# Patient Record
Sex: Male | Born: 1975
Health system: Southern US, Community
[De-identification: ages and names within clinical notes are randomized; demographics above are authoritative.]

## PROBLEM LIST (undated history)

## (undated) DIAGNOSIS — K5730 Diverticulosis of large intestine without perforation or abscess without bleeding: Secondary | ICD-10-CM

## (undated) DIAGNOSIS — G039 Meningitis, unspecified: Secondary | ICD-10-CM

---

## 2009-12-30 ENCOUNTER — Emergency Department (HOSPITAL_COMMUNITY): Admission: EM | Admit: 2009-12-30 | Discharge: 2009-12-30 | Payer: Self-pay | Admitting: Emergency Medicine

## 2010-12-22 ENCOUNTER — Emergency Department (HOSPITAL_COMMUNITY)
Admission: EM | Admit: 2010-12-22 | Discharge: 2010-12-22 | Disposition: A | Discharge status: Home / Self Care | Payer: Self-pay | Attending: Emergency Medicine | Admitting: Emergency Medicine

## 2010-12-22 ENCOUNTER — Emergency Department (HOSPITAL_COMMUNITY): Payer: Self-pay

## 2010-12-22 DIAGNOSIS — M2560 Stiffness of unspecified joint, not elsewhere classified: Secondary | ICD-10-CM | POA: Insufficient documentation

## 2010-12-22 DIAGNOSIS — M545 Low back pain, unspecified: Secondary | ICD-10-CM | POA: Insufficient documentation

## 2015-08-24 ENCOUNTER — Emergency Department (HOSPITAL_BASED_OUTPATIENT_CLINIC_OR_DEPARTMENT_OTHER)
Admission: EM | Admit: 2015-08-24 | Discharge: 2015-08-24 | Disposition: A | Discharge status: Home / Self Care | Payer: BLUE CROSS/BLUE SHIELD | Attending: Emergency Medicine | Admitting: Emergency Medicine

## 2015-08-24 ENCOUNTER — Encounter (HOSPITAL_BASED_OUTPATIENT_CLINIC_OR_DEPARTMENT_OTHER): Payer: Self-pay | Admitting: *Deleted

## 2015-08-24 DIAGNOSIS — Z8719 Personal history of other diseases of the digestive system: Secondary | ICD-10-CM | POA: Insufficient documentation

## 2015-08-24 DIAGNOSIS — M542 Cervicalgia: Secondary | ICD-10-CM | POA: Diagnosis present

## 2015-08-24 DIAGNOSIS — M436 Torticollis: Secondary | ICD-10-CM | POA: Diagnosis not present

## 2015-08-24 DIAGNOSIS — Z8619 Personal history of other infectious and parasitic diseases: Secondary | ICD-10-CM | POA: Diagnosis not present

## 2015-08-24 HISTORY — DX: Diverticulosis of large intestine without perforation or abscess without bleeding: K57.30

## 2015-08-24 HISTORY — DX: Meningitis, unspecified: G03.9

## 2015-08-24 MED ORDER — CYCLOBENZAPRINE HCL 5 MG PO TABS: 5.0000 mg | ORAL_TABLET | Freq: Three times a day (TID) | ORAL | Status: AC | PRN

## 2015-08-24 MED FILL — CYCLOBENZAPRINE 5 MG TABLET: 5 | 6 days supply | Qty: 20 | Fill #0

## 2015-08-24 NOTE — ED Provider Notes (Signed)
CSN: [id number]     Arrival date & time 08/24/15  [id number] History   First MD Initiated Contact with Patient 08/24/15 0935     Chief Complaint  Patient presents with  . Neck Pain     (Consider location/radiation/quality/duration/timing/severity/associated sxs/prior Treatment) HPI  Pt presenting with c/o left sided neck pain.  Pain began when he awoke from sleep. Had used different pillows last night.  Pain is worse with turning his head side to side.  He did take an ibuprofen prior to arrival and states he is now able to move his neck more freely than before.  No fever,  Denies any preceding illness. No headache, no nausea.  He has a hx of viral meningitis years ago but states this feels very different from that.  Pain is sharp and worse with movement and palpation.  He has no changes in vision or speech, no weakness of arms or legs.  He had no preceding trauma.  There are no other associated systemic symptoms, there are no other alleviating or modifying factors.   Past Medical History  Diagnosis Date  . Diverticula of colon   . Meningitis    History reviewed. No pertinent past surgical history. No family history on file. Social History  Substance Use Topics  . Smoking status: Never Smoker   . Smokeless tobacco: None  . Alcohol Use: Yes    Review of Systems  ROS reviewed and all otherwise negative except for mentioned in HPI    Allergies  Shellfish allergy  Home Medications   Prior to Admission medications   Medication Sig Start Date End Date Taking? Authorizing Provider  cyclobenzaprine (FLEXERIL) 5 MG tablet Take 1 tablet (5 mg total) by mouth 3 (three) times daily as needed for muscle spasms. 08/24/15   Jerelyn ScottMartha Linker, MD   BP 139/89 mmHg  Pulse 71  Temp(Src) 98.4 F (36.9 C) (Oral)  Resp 18  Ht 5\' 7"  (1.702 m)  Wt 251 lb (113.853 kg)  BMI 39.30 kg/m2  SpO2 100%  Vitals reviewed Physical Exam  Physical Examination: General appearance - alert, well appearing, and in no  distress Mental status - alert, oriented to person, place, and time Eyes - pupils equal and reactive, extraocular eye movements intact Mouth - mucous membranes moist, pharynx normal without lesions Neck - supple, no significant adenopathy, ttp over left SCM and medial trapezius distribution, some pain with ROM especially turning to the left Chest - clear to auscultation, no wheezes, rales or rhonchi, symmetric air entry Heart - normal rate, regular rhythm, normal S1, S2, no murmurs, rubs, clicks or gallops Neurological - alert, oriented, normal speech, strength 5/5 in extremities x 4 Extremities - peripheral pulses normal, no pedal edema, no clubbing or cyanosis Skin - normal coloration and turgor, no rashes,  ED Course  Procedures (including critical care time) Labs Review Labs Reviewed - No data to display  Imaging Review No results found. I have personally reviewed and evaluated these images and lab results as part of my medical decision-making.   EKG Interpretation None      MDM   Final diagnoses:  Torticollis    Pt presenting with acute onset of left sided neck pain.  Exam is most c/w torticollis.  No meningismus or fever to suggest meningitis.  Neuro exam is normal.  Advised scheduled nsaids, muscle relaxer, heating pad.  Discharged with strict return precautions.  Pt agreeable with plan.    Jerelyn ScottMartha Linker, MD 08/24/15 1053

## 2015-08-24 NOTE — Discharge Instructions (Signed)
Return to the ED with any concerns including weakness of arms or legs, fever, decreased level of alertness/lethargy, or any other alarming symptoms

## 2015-08-24 NOTE — ED Notes (Signed)
Patient states he woke up this morning around 0500 with stiffness in the left neck.  States he slept with a different pillow last night.  Also, patient and spouse states that five years ago, the patient had same neck pain and was positive for viral meningitis.

## 2017-04-05 DIAGNOSIS — M6281 Muscle weakness (generalized): Secondary | ICD-10-CM | POA: Diagnosis not present

## 2017-04-05 DIAGNOSIS — M25562 Pain in left knee: Secondary | ICD-10-CM | POA: Diagnosis not present

## 2017-04-05 DIAGNOSIS — M25662 Stiffness of left knee, not elsewhere classified: Secondary | ICD-10-CM | POA: Diagnosis not present

## 2017-04-05 DIAGNOSIS — G8929 Other chronic pain: Secondary | ICD-10-CM | POA: Diagnosis not present

## 2017-04-08 DIAGNOSIS — M6281 Muscle weakness (generalized): Secondary | ICD-10-CM | POA: Diagnosis not present

## 2017-04-08 DIAGNOSIS — M25662 Stiffness of left knee, not elsewhere classified: Secondary | ICD-10-CM | POA: Diagnosis not present

## 2017-04-08 DIAGNOSIS — M25562 Pain in left knee: Secondary | ICD-10-CM | POA: Diagnosis not present

## 2017-04-08 DIAGNOSIS — R269 Unspecified abnormalities of gait and mobility: Secondary | ICD-10-CM | POA: Diagnosis not present

## 2017-04-09 DIAGNOSIS — G4733 Obstructive sleep apnea (adult) (pediatric): Secondary | ICD-10-CM | POA: Diagnosis not present

## 2017-04-09 DIAGNOSIS — E6609 Other obesity due to excess calories: Secondary | ICD-10-CM | POA: Diagnosis not present

## 2017-04-09 DIAGNOSIS — G8921 Chronic pain due to trauma: Secondary | ICD-10-CM | POA: Diagnosis not present

## 2017-04-10 DIAGNOSIS — M6281 Muscle weakness (generalized): Secondary | ICD-10-CM | POA: Diagnosis not present

## 2017-04-10 DIAGNOSIS — R269 Unspecified abnormalities of gait and mobility: Secondary | ICD-10-CM | POA: Diagnosis not present

## 2017-04-10 DIAGNOSIS — M25562 Pain in left knee: Secondary | ICD-10-CM | POA: Diagnosis not present

## 2017-04-10 DIAGNOSIS — M25662 Stiffness of left knee, not elsewhere classified: Secondary | ICD-10-CM | POA: Diagnosis not present

## 2017-04-12 DIAGNOSIS — M25562 Pain in left knee: Secondary | ICD-10-CM | POA: Diagnosis not present

## 2017-04-12 DIAGNOSIS — M6281 Muscle weakness (generalized): Secondary | ICD-10-CM | POA: Diagnosis not present

## 2017-04-12 DIAGNOSIS — M25662 Stiffness of left knee, not elsewhere classified: Secondary | ICD-10-CM | POA: Diagnosis not present

## 2017-04-12 DIAGNOSIS — R269 Unspecified abnormalities of gait and mobility: Secondary | ICD-10-CM | POA: Diagnosis not present

## 2017-04-22 DIAGNOSIS — M6281 Muscle weakness (generalized): Secondary | ICD-10-CM | POA: Diagnosis not present

## 2017-04-22 DIAGNOSIS — M25562 Pain in left knee: Secondary | ICD-10-CM | POA: Diagnosis not present

## 2017-04-22 DIAGNOSIS — G8929 Other chronic pain: Secondary | ICD-10-CM | POA: Diagnosis not present

## 2017-04-22 DIAGNOSIS — M25662 Stiffness of left knee, not elsewhere classified: Secondary | ICD-10-CM | POA: Diagnosis not present

## 2017-04-24 DIAGNOSIS — G8929 Other chronic pain: Secondary | ICD-10-CM | POA: Diagnosis not present

## 2017-04-24 DIAGNOSIS — M6281 Muscle weakness (generalized): Secondary | ICD-10-CM | POA: Diagnosis not present

## 2017-04-24 DIAGNOSIS — M25562 Pain in left knee: Secondary | ICD-10-CM | POA: Diagnosis not present

## 2017-04-24 DIAGNOSIS — M25662 Stiffness of left knee, not elsewhere classified: Secondary | ICD-10-CM | POA: Diagnosis not present

## 2017-04-26 DIAGNOSIS — M6281 Muscle weakness (generalized): Secondary | ICD-10-CM | POA: Diagnosis not present

## 2017-04-26 DIAGNOSIS — G8929 Other chronic pain: Secondary | ICD-10-CM | POA: Diagnosis not present

## 2017-04-26 DIAGNOSIS — M25662 Stiffness of left knee, not elsewhere classified: Secondary | ICD-10-CM | POA: Diagnosis not present

## 2017-04-26 DIAGNOSIS — M25562 Pain in left knee: Secondary | ICD-10-CM | POA: Diagnosis not present

## 2017-04-29 DIAGNOSIS — M6281 Muscle weakness (generalized): Secondary | ICD-10-CM | POA: Diagnosis not present

## 2017-04-29 DIAGNOSIS — R269 Unspecified abnormalities of gait and mobility: Secondary | ICD-10-CM | POA: Diagnosis not present

## 2017-04-29 DIAGNOSIS — M25562 Pain in left knee: Secondary | ICD-10-CM | POA: Diagnosis not present

## 2017-04-29 DIAGNOSIS — M25662 Stiffness of left knee, not elsewhere classified: Secondary | ICD-10-CM | POA: Diagnosis not present

## 2017-05-01 DIAGNOSIS — R269 Unspecified abnormalities of gait and mobility: Secondary | ICD-10-CM | POA: Diagnosis not present

## 2017-05-01 DIAGNOSIS — M25662 Stiffness of left knee, not elsewhere classified: Secondary | ICD-10-CM | POA: Diagnosis not present

## 2017-05-01 DIAGNOSIS — M25562 Pain in left knee: Secondary | ICD-10-CM | POA: Diagnosis not present

## 2017-05-01 DIAGNOSIS — M6281 Muscle weakness (generalized): Secondary | ICD-10-CM | POA: Diagnosis not present

## 2017-05-02 DIAGNOSIS — E6609 Other obesity due to excess calories: Secondary | ICD-10-CM | POA: Diagnosis not present

## 2017-05-02 DIAGNOSIS — G4733 Obstructive sleep apnea (adult) (pediatric): Secondary | ICD-10-CM | POA: Diagnosis not present

## 2017-05-02 DIAGNOSIS — G8921 Chronic pain due to trauma: Secondary | ICD-10-CM | POA: Diagnosis not present

## 2017-05-03 DIAGNOSIS — M25662 Stiffness of left knee, not elsewhere classified: Secondary | ICD-10-CM | POA: Diagnosis not present

## 2017-05-03 DIAGNOSIS — M6281 Muscle weakness (generalized): Secondary | ICD-10-CM | POA: Diagnosis not present

## 2017-05-03 DIAGNOSIS — M25562 Pain in left knee: Secondary | ICD-10-CM | POA: Diagnosis not present

## 2017-05-03 DIAGNOSIS — R269 Unspecified abnormalities of gait and mobility: Secondary | ICD-10-CM | POA: Diagnosis not present

## 2017-05-06 DIAGNOSIS — M6281 Muscle weakness (generalized): Secondary | ICD-10-CM | POA: Diagnosis not present

## 2017-05-06 DIAGNOSIS — M25662 Stiffness of left knee, not elsewhere classified: Secondary | ICD-10-CM | POA: Diagnosis not present

## 2017-05-06 DIAGNOSIS — M25562 Pain in left knee: Secondary | ICD-10-CM | POA: Diagnosis not present

## 2017-05-06 DIAGNOSIS — R269 Unspecified abnormalities of gait and mobility: Secondary | ICD-10-CM | POA: Diagnosis not present

## 2017-05-08 DIAGNOSIS — M6281 Muscle weakness (generalized): Secondary | ICD-10-CM | POA: Diagnosis not present

## 2017-05-08 DIAGNOSIS — M25662 Stiffness of left knee, not elsewhere classified: Secondary | ICD-10-CM | POA: Diagnosis not present

## 2017-05-08 DIAGNOSIS — R269 Unspecified abnormalities of gait and mobility: Secondary | ICD-10-CM | POA: Diagnosis not present

## 2017-05-08 DIAGNOSIS — M25562 Pain in left knee: Secondary | ICD-10-CM | POA: Diagnosis not present

## 2017-05-22 DIAGNOSIS — R269 Unspecified abnormalities of gait and mobility: Secondary | ICD-10-CM | POA: Diagnosis not present

## 2017-05-22 DIAGNOSIS — M25562 Pain in left knee: Secondary | ICD-10-CM | POA: Diagnosis not present

## 2017-05-22 DIAGNOSIS — G8929 Other chronic pain: Secondary | ICD-10-CM | POA: Diagnosis not present

## 2017-05-22 DIAGNOSIS — M6281 Muscle weakness (generalized): Secondary | ICD-10-CM | POA: Diagnosis not present

## 2017-05-24 DIAGNOSIS — M25662 Stiffness of left knee, not elsewhere classified: Secondary | ICD-10-CM | POA: Diagnosis not present

## 2017-05-24 DIAGNOSIS — M6281 Muscle weakness (generalized): Secondary | ICD-10-CM | POA: Diagnosis not present

## 2017-05-24 DIAGNOSIS — M25562 Pain in left knee: Secondary | ICD-10-CM | POA: Diagnosis not present

## 2017-05-24 DIAGNOSIS — G8929 Other chronic pain: Secondary | ICD-10-CM | POA: Diagnosis not present

## 2017-05-31 DIAGNOSIS — M25662 Stiffness of left knee, not elsewhere classified: Secondary | ICD-10-CM | POA: Diagnosis not present

## 2017-05-31 DIAGNOSIS — M25562 Pain in left knee: Secondary | ICD-10-CM | POA: Diagnosis not present

## 2017-05-31 DIAGNOSIS — M6281 Muscle weakness (generalized): Secondary | ICD-10-CM | POA: Diagnosis not present

## 2017-05-31 DIAGNOSIS — G8929 Other chronic pain: Secondary | ICD-10-CM | POA: Diagnosis not present

## 2017-06-05 DIAGNOSIS — E6609 Other obesity due to excess calories: Secondary | ICD-10-CM | POA: Diagnosis not present

## 2017-06-05 DIAGNOSIS — G8921 Chronic pain due to trauma: Secondary | ICD-10-CM | POA: Diagnosis not present

## 2017-06-10 DIAGNOSIS — M25662 Stiffness of left knee, not elsewhere classified: Secondary | ICD-10-CM | POA: Diagnosis not present

## 2017-06-10 DIAGNOSIS — M6281 Muscle weakness (generalized): Secondary | ICD-10-CM | POA: Diagnosis not present

## 2017-06-10 DIAGNOSIS — G8929 Other chronic pain: Secondary | ICD-10-CM | POA: Diagnosis not present

## 2017-06-10 DIAGNOSIS — M25562 Pain in left knee: Secondary | ICD-10-CM | POA: Diagnosis not present

## 2017-06-12 DIAGNOSIS — M6281 Muscle weakness (generalized): Secondary | ICD-10-CM | POA: Diagnosis not present

## 2017-06-12 DIAGNOSIS — G8929 Other chronic pain: Secondary | ICD-10-CM | POA: Diagnosis not present

## 2017-06-12 DIAGNOSIS — M25562 Pain in left knee: Secondary | ICD-10-CM | POA: Diagnosis not present

## 2017-06-12 DIAGNOSIS — M25662 Stiffness of left knee, not elsewhere classified: Secondary | ICD-10-CM | POA: Diagnosis not present

## 2017-06-14 DIAGNOSIS — M25562 Pain in left knee: Secondary | ICD-10-CM | POA: Diagnosis not present

## 2017-06-14 DIAGNOSIS — G8929 Other chronic pain: Secondary | ICD-10-CM | POA: Diagnosis not present

## 2017-06-14 DIAGNOSIS — M6281 Muscle weakness (generalized): Secondary | ICD-10-CM | POA: Diagnosis not present

## 2017-06-14 DIAGNOSIS — M25662 Stiffness of left knee, not elsewhere classified: Secondary | ICD-10-CM | POA: Diagnosis not present

## 2017-06-17 DIAGNOSIS — M25562 Pain in left knee: Secondary | ICD-10-CM | POA: Diagnosis not present

## 2017-06-17 DIAGNOSIS — M6281 Muscle weakness (generalized): Secondary | ICD-10-CM | POA: Diagnosis not present

## 2017-06-17 DIAGNOSIS — G8929 Other chronic pain: Secondary | ICD-10-CM | POA: Diagnosis not present

## 2017-06-17 DIAGNOSIS — R269 Unspecified abnormalities of gait and mobility: Secondary | ICD-10-CM | POA: Diagnosis not present

## 2017-06-19 DIAGNOSIS — M6281 Muscle weakness (generalized): Secondary | ICD-10-CM | POA: Diagnosis not present

## 2017-06-19 DIAGNOSIS — M25562 Pain in left knee: Secondary | ICD-10-CM | POA: Diagnosis not present

## 2017-06-19 DIAGNOSIS — G8929 Other chronic pain: Secondary | ICD-10-CM | POA: Diagnosis not present

## 2017-06-19 DIAGNOSIS — M25662 Stiffness of left knee, not elsewhere classified: Secondary | ICD-10-CM | POA: Diagnosis not present

## 2017-06-21 DIAGNOSIS — M6281 Muscle weakness (generalized): Secondary | ICD-10-CM | POA: Diagnosis not present

## 2017-06-21 DIAGNOSIS — M25662 Stiffness of left knee, not elsewhere classified: Secondary | ICD-10-CM | POA: Diagnosis not present

## 2017-06-21 DIAGNOSIS — G8929 Other chronic pain: Secondary | ICD-10-CM | POA: Diagnosis not present

## 2017-06-21 DIAGNOSIS — M25562 Pain in left knee: Secondary | ICD-10-CM | POA: Diagnosis not present

## 2017-07-09 DIAGNOSIS — Z86718 Personal history of other venous thrombosis and embolism: Secondary | ICD-10-CM | POA: Diagnosis not present

## 2017-07-09 DIAGNOSIS — G8921 Chronic pain due to trauma: Secondary | ICD-10-CM | POA: Diagnosis not present

## 2017-07-10 ENCOUNTER — Other Ambulatory Visit: Payer: Self-pay | Admitting: Internal Medicine

## 2017-07-15 ENCOUNTER — Other Ambulatory Visit: Payer: Self-pay | Admitting: Internal Medicine

## 2017-07-15 DIAGNOSIS — R609 Edema, unspecified: Secondary | ICD-10-CM

## 2017-07-17 ENCOUNTER — Other Ambulatory Visit: Payer: BLUE CROSS/BLUE SHIELD

## 2017-07-23 DIAGNOSIS — Z0000 Encounter for general adult medical examination without abnormal findings: Secondary | ICD-10-CM | POA: Diagnosis not present

## 2017-08-09 DIAGNOSIS — R7303 Prediabetes: Secondary | ICD-10-CM | POA: Diagnosis not present

## 2017-08-09 DIAGNOSIS — Z0001 Encounter for general adult medical examination with abnormal findings: Secondary | ICD-10-CM | POA: Diagnosis not present

## 2017-08-09 DIAGNOSIS — Z23 Encounter for immunization: Secondary | ICD-10-CM | POA: Diagnosis not present

## 2017-08-09 DIAGNOSIS — G8921 Chronic pain due to trauma: Secondary | ICD-10-CM | POA: Diagnosis not present

## 2017-09-11 DIAGNOSIS — G8921 Chronic pain due to trauma: Secondary | ICD-10-CM | POA: Diagnosis not present

## 2017-10-10 DIAGNOSIS — G8921 Chronic pain due to trauma: Secondary | ICD-10-CM | POA: Diagnosis not present

## 2017-11-07 DIAGNOSIS — G8921 Chronic pain due to trauma: Secondary | ICD-10-CM | POA: Diagnosis not present

## 2017-12-10 DIAGNOSIS — G8921 Chronic pain due to trauma: Secondary | ICD-10-CM | POA: Diagnosis not present

## 2018-01-07 DIAGNOSIS — Z760 Encounter for issue of repeat prescription: Secondary | ICD-10-CM | POA: Diagnosis not present

## 2018-01-07 DIAGNOSIS — G8921 Chronic pain due to trauma: Secondary | ICD-10-CM | POA: Diagnosis not present

## 2018-02-06 DIAGNOSIS — L259 Unspecified contact dermatitis, unspecified cause: Secondary | ICD-10-CM | POA: Diagnosis not present

## 2018-02-06 DIAGNOSIS — G8921 Chronic pain due to trauma: Secondary | ICD-10-CM | POA: Diagnosis not present

## 2018-03-06 DIAGNOSIS — G8921 Chronic pain due to trauma: Secondary | ICD-10-CM | POA: Diagnosis not present

## 2018-03-19 ENCOUNTER — Emergency Department
Admission: EM | Admit: 2018-03-19 | Discharge: 2018-03-19 | Disposition: A | Discharge status: Home / Self Care | Payer: BLUE CROSS/BLUE SHIELD | Attending: Emergency Medicine | Admitting: Emergency Medicine

## 2018-03-19 ENCOUNTER — Emergency Department: Payer: BLUE CROSS/BLUE SHIELD

## 2018-03-19 ENCOUNTER — Other Ambulatory Visit: Payer: Self-pay

## 2018-03-19 ENCOUNTER — Encounter: Payer: Self-pay | Admitting: Emergency Medicine

## 2018-03-19 DIAGNOSIS — Y990 Civilian activity done for income or pay: Secondary | ICD-10-CM | POA: Diagnosis not present

## 2018-03-19 DIAGNOSIS — Y9259 Other trade areas as the place of occurrence of the external cause: Secondary | ICD-10-CM | POA: Insufficient documentation

## 2018-03-19 DIAGNOSIS — M238X2 Other internal derangements of left knee: Secondary | ICD-10-CM | POA: Diagnosis not present

## 2018-03-19 DIAGNOSIS — Y9389 Activity, other specified: Secondary | ICD-10-CM | POA: Diagnosis not present

## 2018-03-19 DIAGNOSIS — S8992XA Unspecified injury of left lower leg, initial encounter: Secondary | ICD-10-CM | POA: Diagnosis not present

## 2018-03-19 DIAGNOSIS — M2392 Unspecified internal derangement of left knee: Secondary | ICD-10-CM | POA: Diagnosis not present

## 2018-03-19 DIAGNOSIS — W19XXXA Unspecified fall, initial encounter: Secondary | ICD-10-CM | POA: Diagnosis not present

## 2018-03-19 DIAGNOSIS — W010XXA Fall on same level from slipping, tripping and stumbling without subsequent striking against object, initial encounter: Secondary | ICD-10-CM | POA: Insufficient documentation

## 2018-03-19 DIAGNOSIS — M25562 Pain in left knee: Secondary | ICD-10-CM | POA: Diagnosis not present

## 2018-03-19 MED ORDER — OXYCODONE-ACETAMINOPHEN 5-325 MG PO TABS
2.0000 | ORAL_TABLET | Freq: Once | ORAL | Status: AC
  Administered 2018-03-19: 2 via ORAL
  Filled 2018-03-19: qty 2

## 2018-03-19 NOTE — ED Notes (Signed)
Attempting to discharge pt , pt not clear on discharge /return to work note.  Spoke with provider Bjorn Loser PA to come speak with pt to help field concerns.  Pt prefers wife to be at bedside to explain return to work note. Waiting on wife

## 2018-03-19 NOTE — ED Provider Notes (Signed)
[facility] Emergency Department Provider Note  ____________________________________________   First MD Initiated Contact with Patient 03/19/18 1113     (approximate)  I have reviewed the triage vital signs and the nursing notes.   HISTORY  Chief Complaint Fall and Knee Pain   HPI Alexander Hill is a 42 y.o. male presents to the ED after he fell at work today due to a wet floor.  Patient states that he did not hit his head and there was no loss of consciousness.  He states that his left knee went behind him.  He presents to the ED via EMS.  He has a history of injury to his left knee and had surgery in [place] for this.  He currently has issues chronically with his knee and is on fentanyl patches, Percocet 10 mg, and Lidoderm patches for chronic pain.  Patient states that he had not taken the Percocet this morning prior to his injury.  He rates his pain as a 10/10.  Patient is finding this is Workmen's Comp.    Past Medical History:  Diagnosis Date  . Diverticula of colon   . Meningitis     There are no active problems to display for this patient.   History reviewed. No pertinent surgical history.  Prior to Admission medications   Medication Sig Start Date End Date Taking? Authorizing Provider  cyclobenzaprine (FLEXERIL) 5 MG tablet Take 1 tablet (5 mg total) by mouth 3 (three) times daily as needed for muscle spasms. 08/24/15   Mabe, Latanya Maudlin, MD    Allergies Shellfish allergy  No family history on file.  Social History Social History   Tobacco Use  . Smoking status: Never Smoker  . Smokeless tobacco: Never Used  Substance Use Topics  . Alcohol use: Yes    Comment: occas  . Drug use: No    Comment: pt takes prescribed pain medication for left knee pain    Review of Systems Constitutional: No fever/chills Eyes: No visual changes. ENT: No trauma. Cardiovascular: Denies chest pain. Respiratory: Denies shortness of  breath. Gastrointestinal:  No nausea, no vomiting.  Musculoskeletal: Positive for left knee pain.  Positive for chronic knee pain. Skin: Negative for rash. Neurological: Negative for headaches, focal weakness or numbness. ____________________________________________   PHYSICAL EXAM:  VITAL SIGNS: ED Triage Vitals  Enc Vitals Group     BP 03/19/18 1109 112/76     Pulse Rate 03/19/18 1109 72     Resp 03/19/18 1109 16     Temp 03/19/18 1109 97.7 F (36.5 C)     Temp Source 03/19/18 1109 Oral     SpO2 03/19/18 1109 97 %     Weight 03/19/18 1110 230 lb (104.3 kg)     Height 03/19/18 1110 5\' 7"  (1.702 m)     Head Circumference --      Peak Flow --      Pain Score 03/19/18 1110 10     Pain Loc --      Pain Edu? --      Excl. in GC? --    Constitutional: Alert and oriented. Well appearing and in no acute distress. Eyes: Conjunctivae are normal. PERRL. EOMI. Head: Atraumatic. Nose: No trauma. Neck: No stridor.   Cardiovascular: Normal rate, regular rhythm. Grossly normal heart sounds.  Good peripheral circulation. Respiratory: Normal respiratory effort.  No retractions. Lungs CTAB. Gastrointestinal: Soft and nontender. No distention.  Musculoskeletal: Examination of left knee there is soft tissue swelling present.  There is no effusion appreciated.  Range of motion is difficult to assess as patient appears to be in a great deal of pain.  He is guarding against any movement at this time.  Skin is intact.  No ecchymosis or abrasions are seen.  Motor or sensory function intact distally.  Pulse present.  Capillary refill is less than 3 seconds.  Patient has a well-healed vertical surgical scar from previous surgery anteriorly. Neurologic:  Normal speech and language. No gross focal neurologic deficits are appreciated.  Skin:  Skin is warm, dry and intact. No rash noted. Psychiatric: Mood and affect are normal. Speech and behavior are  normal.  ____________________________________________   LABS (all labs ordered are listed, but only abnormal results are displayed)  Labs Reviewed - No data to display RADIOLOGY  ED MD interpretation:  Left knee x-ray is negative for acute bony injury.  Total knee replacement hardware is in place.  Official radiology report(s): Dg Knee Complete 4 Views Left  Result Date: 03/19/2018 CLINICAL DATA:  Fall EXAM: LEFT KNEE - COMPLETE 4+ VIEW COMPARISON:  None. FINDINGS: No acute fracture. No dislocation. Postoperative and degenerative changes are noted. A screw is present in the lateral femoral condyle. It is slightly retracted out of the bone. Calcification in the medial collateral ligament. IMPRESSION: No acute bony pathology. Electronically Signed   By: Jolaine Click M.D.   On: 03/19/2018 14:19    ____________________________________________   PROCEDURES  Procedure(s) performed:   .Splint Application Date/Time: 03/19/2018 3:44 PM Performed by: Fran Lowes, NT Authorized by: Tommi Rumps, PA-C   Consent:    Consent obtained:  Verbal   Consent given by:  Patient   Risks discussed:  Pain   Alternatives discussed:  Referral Procedure details:    Laterality:  Left   Location:  Knee   Knee:  L knee   Strapping: yes     Splint type:  Knee immobilizer   Supplies:  Prefabricated splint Post-procedure details:    Pain:  Improved   Sensation:  Normal   Patient tolerance of procedure:  Tolerated well, no immediate complications    Critical Care performed: No  ____________________________________________   INITIAL IMPRESSION / ASSESSMENT AND PLAN / ED COURSE  As part of my medical decision making, I reviewed the following data within the electronic MEDICAL RECORD NUMBER Notes from prior ED visits and [place] Controlled Substance Database  Patient presents to the ED after a fall while at work injuring his left knee.  Patient has chronic pain issues with his left knee as a  result of his total knee replacement.  Patient denies any head injury or loss of consciousness.  He states that he fell with his leg bent backwards.  Patient chronically uses a fentanyl patch and Percocet 10 mg for his chronic knee pain.  X-rays were obtained and no acute bony changes were noted.  Total knee hardware was in place.  This was discussed with the patient.  Knee immobilizer and crutches were given to the patient.  He is instructed to ice and elevate as needed for pain and swelling.  He will continue with his regular pain medication as prescribed by his orthopedist.  Patient was given a letter to take to his supervisor with a list of limitations.  Patient became extremely disgruntled stating that he could not do anything and is agreeing with the date on his letter stating that he can return to work tomorrow with limitations..  He was also not understanding  that he needed to take the letter to his supervisor since this was a Workmen's Comp. injury.  Wife was present and this information was relayed to her with his permission to do so.  Both she states that they will go to his orthopedist that he already has established.   ____________________________________________   FINAL CLINICAL IMPRESSION(S) / ED DIAGNOSES  Final diagnoses:  Acute internal derangement of left knee     ED Discharge Orders    None       Note:  This document was prepared using Dragon voice recognition software and may include unintentional dictation errors.    Tommi Rumps, PA-C 03/19/18 1555    Jeanmarie Plant, MD 03/20/18 [phone]

## 2018-03-19 NOTE — ED Triage Notes (Signed)
Pt here via EMS from work after slipping and falling, landing with left knee bent behind him, had left knee surgery last year and still dealing with pain from surgery. Grabbing sides of bed and appears in pain, filing WC.

## 2018-03-19 NOTE — ED Notes (Signed)
Regarding workman's comp.  Spoke with pt and pt gave number to [employer] [phone]) and supervisors name Derek Mound). This tech called [employer] and was transferred to [place]. Derek Mound then transferred this tech to Electronics engineer. Social research officer, government did not pick up and this tech left a voicemail with a call back number for the manager to call.

## 2018-03-19 NOTE — ED Notes (Signed)
This tech collected the urine drug screen at 1215 and took it to the lab by 1218 to await pickup.

## 2018-03-19 NOTE — ED Triage Notes (Signed)
Denies LOC

## 2018-03-19 NOTE — Discharge Instructions (Signed)
Follow-up with the orthopedist on your discharge papers.  Take work restriction papers to Proofreader.  Ice and elevate your knee as needed for swelling and pain.  Use knee immobilizer for support and protection.  Up walking with crutches only until seen by the orthopedist.  If your company has a different orthopedist for you to follow-up with they will let you know.  Continue taking your regular medication as prescribed by your doctor.

## 2018-03-19 NOTE — ED Notes (Signed)
This tech received a call back from the office manager of [employer] [phone]). She stated that both the urine drug screen and the breath analysis are required.

## 2018-03-20 DIAGNOSIS — G8921 Chronic pain due to trauma: Secondary | ICD-10-CM | POA: Diagnosis not present

## 2018-03-20 DIAGNOSIS — W19XXXA Unspecified fall, initial encounter: Secondary | ICD-10-CM | POA: Diagnosis not present

## 2018-03-20 DIAGNOSIS — M25562 Pain in left knee: Secondary | ICD-10-CM | POA: Diagnosis not present

## 2018-03-20 DIAGNOSIS — M542 Cervicalgia: Secondary | ICD-10-CM | POA: Diagnosis not present

## 2018-03-21 ENCOUNTER — Other Ambulatory Visit: Payer: Self-pay | Admitting: Internal Medicine

## 2018-03-21 ENCOUNTER — Ambulatory Visit
Admission: RE | Admit: 2018-03-21 | Discharge: 2018-03-21 | Disposition: A | Discharge status: Home / Self Care | Payer: BLUE CROSS/BLUE SHIELD | Source: Ambulatory Visit | Attending: Internal Medicine | Admitting: Internal Medicine

## 2018-03-21 DIAGNOSIS — M542 Cervicalgia: Secondary | ICD-10-CM

## 2018-03-21 DIAGNOSIS — S199XXA Unspecified injury of neck, initial encounter: Secondary | ICD-10-CM | POA: Diagnosis not present

## 2018-03-24 DIAGNOSIS — M25562 Pain in left knee: Secondary | ICD-10-CM | POA: Diagnosis not present

## 2018-03-24 DIAGNOSIS — G8921 Chronic pain due to trauma: Secondary | ICD-10-CM | POA: Diagnosis not present

## 2018-03-24 DIAGNOSIS — T50905A Adverse effect of unspecified drugs, medicaments and biological substances, initial encounter: Secondary | ICD-10-CM | POA: Diagnosis not present

## 2018-04-01 DIAGNOSIS — M25521 Pain in right elbow: Secondary | ICD-10-CM | POA: Diagnosis not present

## 2018-04-01 DIAGNOSIS — M25562 Pain in left knee: Secondary | ICD-10-CM | POA: Diagnosis not present

## 2018-04-08 DIAGNOSIS — G8921 Chronic pain due to trauma: Secondary | ICD-10-CM | POA: Diagnosis not present

## 2018-04-14 DIAGNOSIS — M25521 Pain in right elbow: Secondary | ICD-10-CM | POA: Diagnosis not present

## 2018-04-18 DIAGNOSIS — M25562 Pain in left knee: Secondary | ICD-10-CM | POA: Diagnosis not present

## 2018-04-18 DIAGNOSIS — H1013 Acute atopic conjunctivitis, bilateral: Secondary | ICD-10-CM | POA: Diagnosis not present

## 2018-05-07 DIAGNOSIS — G8921 Chronic pain due to trauma: Secondary | ICD-10-CM | POA: Diagnosis not present

## 2018-06-13 DIAGNOSIS — R7303 Prediabetes: Secondary | ICD-10-CM | POA: Diagnosis not present

## 2018-06-13 DIAGNOSIS — G8921 Chronic pain due to trauma: Secondary | ICD-10-CM | POA: Diagnosis not present

## 2018-07-11 DIAGNOSIS — R7303 Prediabetes: Secondary | ICD-10-CM | POA: Diagnosis not present

## 2018-07-11 DIAGNOSIS — G8921 Chronic pain due to trauma: Secondary | ICD-10-CM | POA: Diagnosis not present

## 2018-07-24 DIAGNOSIS — R7303 Prediabetes: Secondary | ICD-10-CM | POA: Diagnosis not present

## 2018-07-24 DIAGNOSIS — Z0001 Encounter for general adult medical examination with abnormal findings: Secondary | ICD-10-CM | POA: Diagnosis not present

## 2018-07-24 DIAGNOSIS — Z136 Encounter for screening for cardiovascular disorders: Secondary | ICD-10-CM | POA: Diagnosis not present

## 2018-08-04 DIAGNOSIS — G8921 Chronic pain due to trauma: Secondary | ICD-10-CM | POA: Diagnosis not present

## 2018-09-09 ENCOUNTER — Other Ambulatory Visit: Payer: Self-pay | Admitting: Internal Medicine

## 2018-09-09 ENCOUNTER — Other Ambulatory Visit: Payer: Self-pay

## 2018-09-09 ENCOUNTER — Ambulatory Visit
Admission: RE | Admit: 2018-09-09 | Discharge: 2018-09-09 | Disposition: A | Discharge status: Home / Self Care | Payer: BLUE CROSS/BLUE SHIELD | Source: Ambulatory Visit | Attending: Internal Medicine | Admitting: Internal Medicine

## 2018-09-09 DIAGNOSIS — M79662 Pain in left lower leg: Secondary | ICD-10-CM

## 2018-09-09 DIAGNOSIS — G8921 Chronic pain due to trauma: Secondary | ICD-10-CM | POA: Diagnosis not present

## 2018-09-09 DIAGNOSIS — M79605 Pain in left leg: Secondary | ICD-10-CM | POA: Diagnosis not present

## 2018-09-30 DIAGNOSIS — M79662 Pain in left lower leg: Secondary | ICD-10-CM | POA: Diagnosis not present

## 2018-09-30 DIAGNOSIS — G8921 Chronic pain due to trauma: Secondary | ICD-10-CM | POA: Diagnosis not present

## 2018-11-05 DIAGNOSIS — G8921 Chronic pain due to trauma: Secondary | ICD-10-CM | POA: Diagnosis not present

## 2018-12-02 DIAGNOSIS — G8921 Chronic pain due to trauma: Secondary | ICD-10-CM | POA: Diagnosis not present

## 2018-12-02 DIAGNOSIS — Z0001 Encounter for general adult medical examination with abnormal findings: Secondary | ICD-10-CM | POA: Diagnosis not present

## 2018-12-02 DIAGNOSIS — Z23 Encounter for immunization: Secondary | ICD-10-CM | POA: Diagnosis not present

## 2018-12-02 DIAGNOSIS — F329 Major depressive disorder, single episode, unspecified: Secondary | ICD-10-CM | POA: Diagnosis not present

## 2019-01-05 DIAGNOSIS — G8921 Chronic pain due to trauma: Secondary | ICD-10-CM | POA: Diagnosis not present

## 2019-01-05 DIAGNOSIS — F39 Unspecified mood [affective] disorder: Secondary | ICD-10-CM | POA: Diagnosis not present

## 2019-01-12 IMAGING — CR DG CERVICAL SPINE 2 OR 3 VIEWS
4 series · 4 of 4 positions shown · non-contrast
Comparison: None.

CLINICAL DATA: Fell at work pain at the base of the neck

EXAM:
CERVICAL SPINE - 2-3 VIEW

[w cervical spine lat]
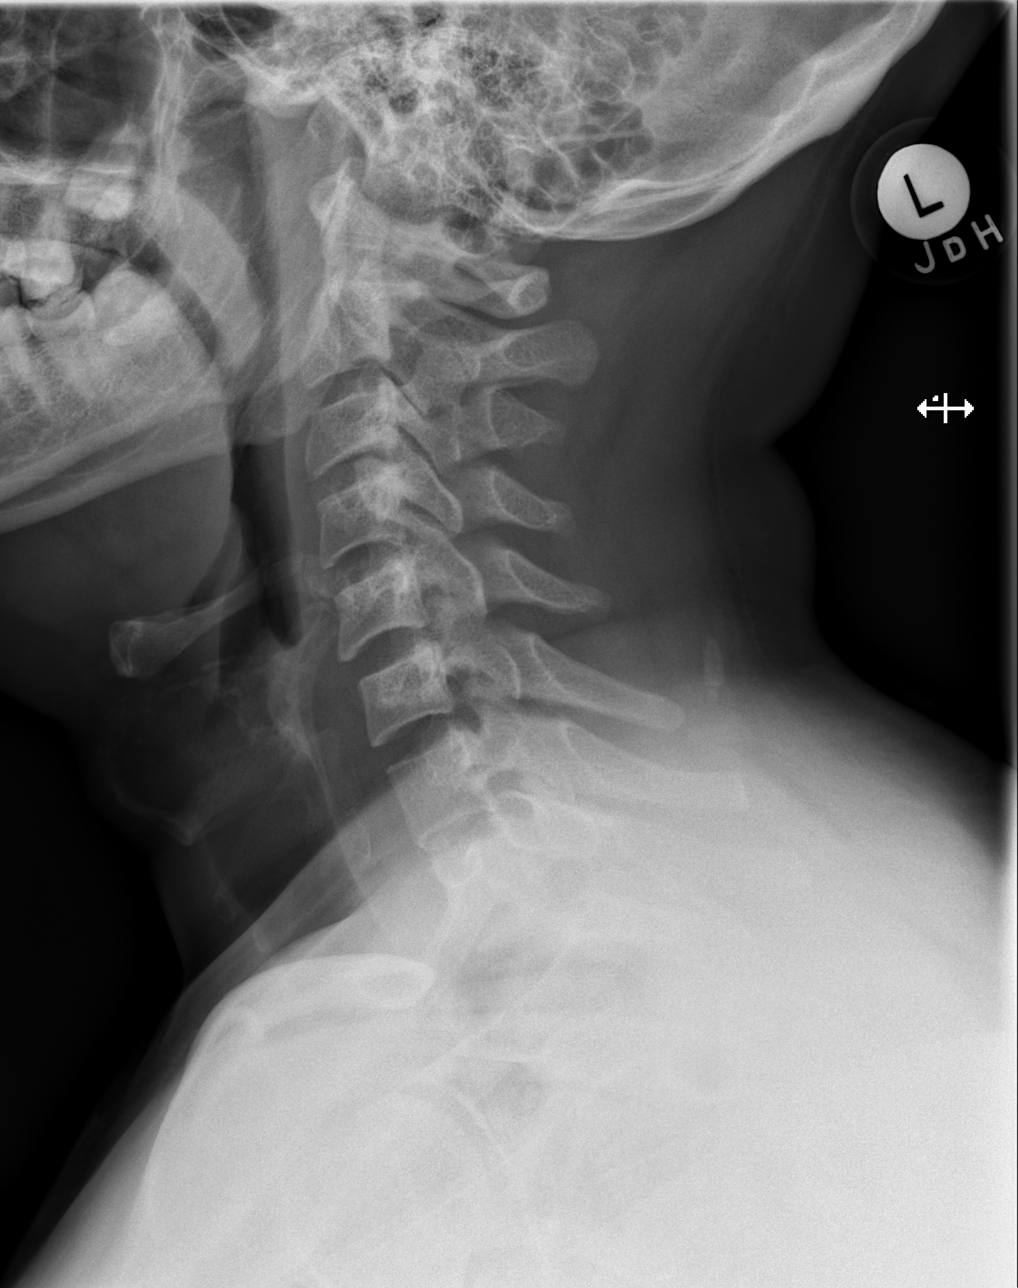

[w cervical spine ap]
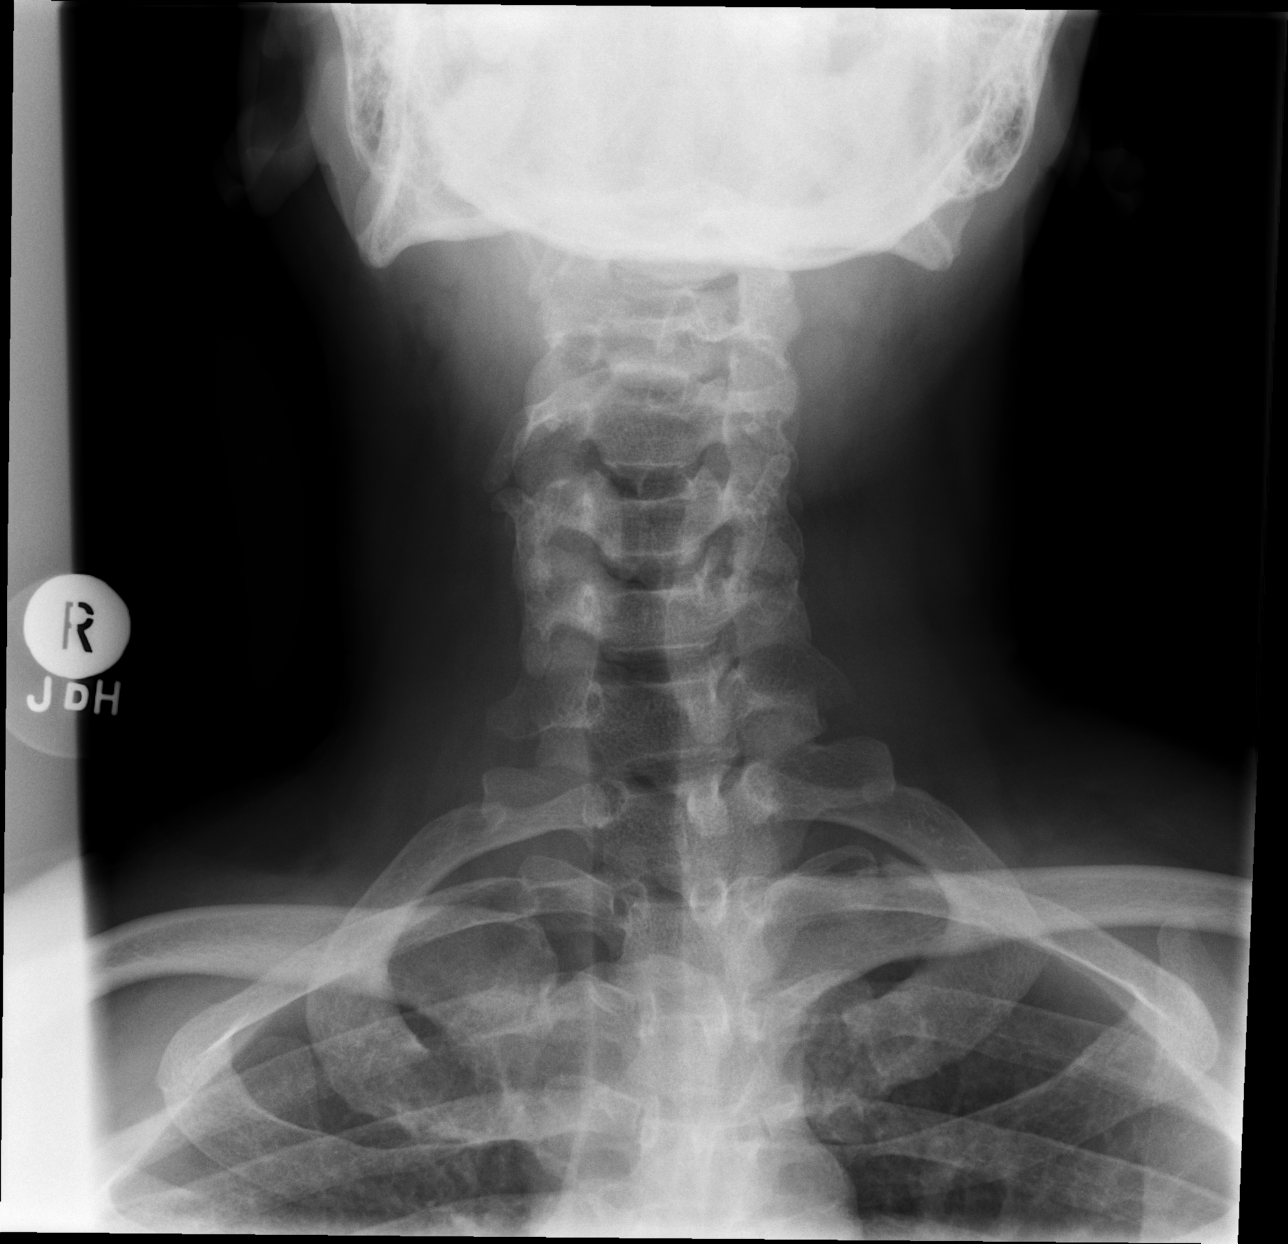

[w cervical spine odontoid (1 of 2)]
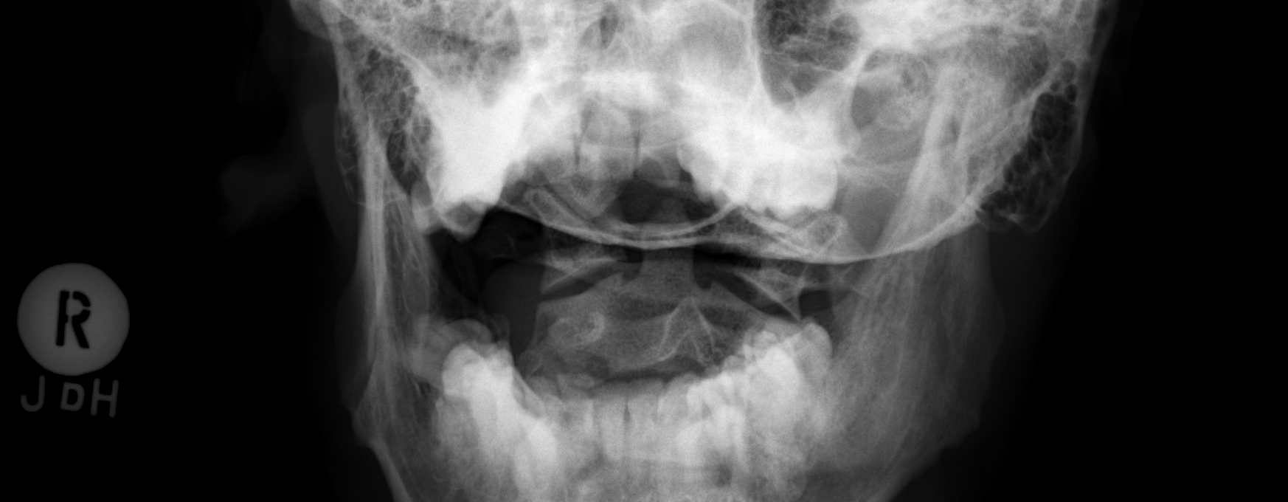

[w cervical spine odontoid (2 of 2)]
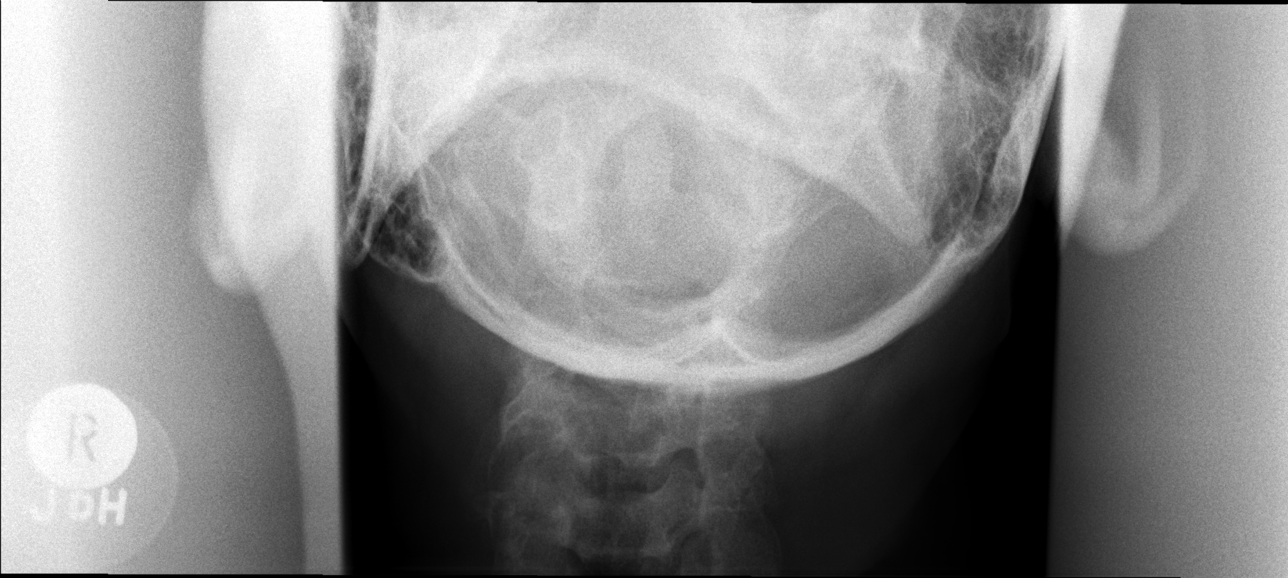

[4 of 4 positions shown; findings below may reference images not displayed]

FINDINGS: There is no evidence of cervical spine fracture or prevertebral soft
tissue swelling. Alignment is normal. No other significant bone
abnormalities are identified.
IMPRESSION: Negative cervical spine radiographs.

## 2019-02-03 DIAGNOSIS — G8921 Chronic pain due to trauma: Secondary | ICD-10-CM | POA: Diagnosis not present

## 2019-02-03 DIAGNOSIS — I10 Essential (primary) hypertension: Secondary | ICD-10-CM | POA: Diagnosis not present

## 2019-02-03 DIAGNOSIS — E6609 Other obesity due to excess calories: Secondary | ICD-10-CM | POA: Diagnosis not present

## 2019-02-03 DIAGNOSIS — R7303 Prediabetes: Secondary | ICD-10-CM | POA: Diagnosis not present

## 2019-02-03 DIAGNOSIS — R3121 Asymptomatic microscopic hematuria: Secondary | ICD-10-CM | POA: Diagnosis not present

## 2019-02-05 DIAGNOSIS — G8921 Chronic pain due to trauma: Secondary | ICD-10-CM | POA: Diagnosis not present

## 2019-03-05 DIAGNOSIS — I10 Essential (primary) hypertension: Secondary | ICD-10-CM | POA: Diagnosis not present

## 2019-03-05 DIAGNOSIS — R7303 Prediabetes: Secondary | ICD-10-CM | POA: Diagnosis not present

## 2019-03-11 DIAGNOSIS — I10 Essential (primary) hypertension: Secondary | ICD-10-CM | POA: Diagnosis not present

## 2019-03-11 DIAGNOSIS — G8921 Chronic pain due to trauma: Secondary | ICD-10-CM | POA: Diagnosis not present

## 2019-03-11 DIAGNOSIS — R7303 Prediabetes: Secondary | ICD-10-CM | POA: Diagnosis not present

## 2019-04-07 DIAGNOSIS — F329 Major depressive disorder, single episode, unspecified: Secondary | ICD-10-CM | POA: Diagnosis not present

## 2019-04-07 DIAGNOSIS — G8921 Chronic pain due to trauma: Secondary | ICD-10-CM | POA: Diagnosis not present

## 2019-04-07 DIAGNOSIS — I10 Essential (primary) hypertension: Secondary | ICD-10-CM | POA: Diagnosis not present

## 2019-05-14 DIAGNOSIS — G8921 Chronic pain due to trauma: Secondary | ICD-10-CM | POA: Diagnosis not present

## 2019-05-14 DIAGNOSIS — F329 Major depressive disorder, single episode, unspecified: Secondary | ICD-10-CM | POA: Diagnosis not present

## 2019-05-14 DIAGNOSIS — I10 Essential (primary) hypertension: Secondary | ICD-10-CM | POA: Diagnosis not present

## 2019-05-19 DIAGNOSIS — Z20828 Contact with and (suspected) exposure to other viral communicable diseases: Secondary | ICD-10-CM | POA: Diagnosis not present

## 2019-06-18 DIAGNOSIS — U071 COVID-19: Secondary | ICD-10-CM | POA: Diagnosis not present

## 2019-06-18 DIAGNOSIS — G8921 Chronic pain due to trauma: Secondary | ICD-10-CM | POA: Diagnosis not present

## 2019-06-18 DIAGNOSIS — I10 Essential (primary) hypertension: Secondary | ICD-10-CM | POA: Diagnosis not present

## 2019-06-18 DIAGNOSIS — Z79899 Other long term (current) drug therapy: Secondary | ICD-10-CM | POA: Diagnosis not present

## 2019-06-18 DIAGNOSIS — F329 Major depressive disorder, single episode, unspecified: Secondary | ICD-10-CM | POA: Diagnosis not present

## 2019-06-27 ENCOUNTER — Other Ambulatory Visit: Payer: Self-pay | Admitting: Internal Medicine

## 2019-06-27 DIAGNOSIS — M79662 Pain in left lower leg: Secondary | ICD-10-CM

## 2019-07-15 DIAGNOSIS — M79662 Pain in left lower leg: Secondary | ICD-10-CM | POA: Diagnosis not present

## 2019-07-15 DIAGNOSIS — I10 Essential (primary) hypertension: Secondary | ICD-10-CM | POA: Diagnosis not present

## 2019-07-15 DIAGNOSIS — F329 Major depressive disorder, single episode, unspecified: Secondary | ICD-10-CM | POA: Diagnosis not present

## 2019-07-15 DIAGNOSIS — G8921 Chronic pain due to trauma: Secondary | ICD-10-CM | POA: Diagnosis not present

## 2019-07-15 DIAGNOSIS — Z79899 Other long term (current) drug therapy: Secondary | ICD-10-CM | POA: Diagnosis not present

## 2019-08-04 DIAGNOSIS — R7303 Prediabetes: Secondary | ICD-10-CM | POA: Diagnosis not present

## 2019-08-04 DIAGNOSIS — I10 Essential (primary) hypertension: Secondary | ICD-10-CM | POA: Diagnosis not present

## 2019-08-04 DIAGNOSIS — Z136 Encounter for screening for cardiovascular disorders: Secondary | ICD-10-CM | POA: Diagnosis not present

## 2019-08-05 DIAGNOSIS — Z79899 Other long term (current) drug therapy: Secondary | ICD-10-CM | POA: Diagnosis not present

## 2019-08-05 DIAGNOSIS — G8921 Chronic pain due to trauma: Secondary | ICD-10-CM | POA: Diagnosis not present

## 2019-08-05 DIAGNOSIS — I10 Essential (primary) hypertension: Secondary | ICD-10-CM | POA: Diagnosis not present

## 2019-09-09 DIAGNOSIS — G8921 Chronic pain due to trauma: Secondary | ICD-10-CM | POA: Diagnosis not present

## 2019-09-09 DIAGNOSIS — I10 Essential (primary) hypertension: Secondary | ICD-10-CM | POA: Diagnosis not present

## 2019-09-10 ENCOUNTER — Ambulatory Visit: Payer: BC Managed Care – PPO | Attending: Internal Medicine

## 2019-09-21 ENCOUNTER — Ambulatory Visit: Payer: BC Managed Care – PPO

## 2019-09-24 ENCOUNTER — Ambulatory Visit: Payer: BC Managed Care – PPO | Attending: Internal Medicine

## 2019-09-24 DIAGNOSIS — Z23 Encounter for immunization: Secondary | ICD-10-CM

## 2019-09-24 NOTE — Progress Notes (Signed)
   Covid-19 Vaccination Clinic  Name:  Tabius Rood    MRN: [id number] DOB: 06-Jan-1976  09/24/2019  Mr. Mkrtchyan was observed post Covid-19 immunization for 15 minutes without incident. He was provided with Vaccine Information Sheet and instruction to access the V-Safe system.   Mr. Enyeart was instructed to call 911 with any severe reactions post vaccine: Marland Kitchen Difficulty breathing  . Swelling of face and throat  . A fast heartbeat  . A bad rash all over body  . Dizziness and weakness   Immunizations Administered    Name Date Dose VIS Date Route   Pfizer COVID-19 Vaccine 09/24/2019  8:18 AM 0.3 mL 07/29/2018 Intramuscular   Manufacturer: [employer], [employer]   Lot: [id number]   NDC: [address]-7

## 2019-10-14 DIAGNOSIS — Z79899 Other long term (current) drug therapy: Secondary | ICD-10-CM | POA: Diagnosis not present

## 2019-10-14 DIAGNOSIS — Z0001 Encounter for general adult medical examination with abnormal findings: Secondary | ICD-10-CM | POA: Diagnosis not present

## 2019-10-14 DIAGNOSIS — Z23 Encounter for immunization: Secondary | ICD-10-CM | POA: Diagnosis not present

## 2019-10-14 DIAGNOSIS — Z136 Encounter for screening for cardiovascular disorders: Secondary | ICD-10-CM | POA: Diagnosis not present

## 2019-10-14 DIAGNOSIS — Z131 Encounter for screening for diabetes mellitus: Secondary | ICD-10-CM | POA: Diagnosis not present

## 2019-10-14 DIAGNOSIS — I10 Essential (primary) hypertension: Secondary | ICD-10-CM | POA: Diagnosis not present

## 2019-10-19 ENCOUNTER — Ambulatory Visit: Payer: BC Managed Care – PPO | Attending: Internal Medicine

## 2019-10-19 DIAGNOSIS — Z23 Encounter for immunization: Secondary | ICD-10-CM

## 2019-10-19 NOTE — Progress Notes (Signed)
   Covid-19 Vaccination Clinic  Name:  Alexander Hill    MRN: [id number] DOB: 1976/02/09  10/19/2019  Mr. [name] was observed post Covid-19 immunization for 15 minutes without incident. He was provided with Vaccine Information Sheet and instruction to access the V-Safe system.   Mr. [name] was instructed to call 911 with any severe reactions post vaccine: Marland Kitchen Difficulty breathing  . Swelling of face and throat  . A fast heartbeat  . A bad rash all over body  . Dizziness and weakness   Immunizations Administered    Name Date Dose VIS Date Route   Pfizer COVID-19 Vaccine 10/19/2019  8:10 AM 0.3 mL 07/29/2018 Intramuscular   Manufacturer: [employer], [employer]   Lot: [id number]   NDC: [address]-4

## 2019-11-10 DIAGNOSIS — G8921 Chronic pain due to trauma: Secondary | ICD-10-CM | POA: Diagnosis not present

## 2019-11-10 DIAGNOSIS — I10 Essential (primary) hypertension: Secondary | ICD-10-CM | POA: Diagnosis not present

## 2019-12-03 DIAGNOSIS — I10 Essential (primary) hypertension: Secondary | ICD-10-CM | POA: Diagnosis not present

## 2019-12-03 DIAGNOSIS — S8992XD Unspecified injury of left lower leg, subsequent encounter: Secondary | ICD-10-CM | POA: Diagnosis not present

## 2019-12-03 DIAGNOSIS — G8921 Chronic pain due to trauma: Secondary | ICD-10-CM | POA: Diagnosis not present

## 2019-12-10 DIAGNOSIS — M1712 Unilateral primary osteoarthritis, left knee: Secondary | ICD-10-CM | POA: Diagnosis not present

## 2020-01-04 DIAGNOSIS — G8921 Chronic pain due to trauma: Secondary | ICD-10-CM | POA: Diagnosis not present

## 2020-01-04 DIAGNOSIS — I10 Essential (primary) hypertension: Secondary | ICD-10-CM | POA: Diagnosis not present

## 2020-01-04 DIAGNOSIS — Z20828 Contact with and (suspected) exposure to other viral communicable diseases: Secondary | ICD-10-CM | POA: Diagnosis not present

## 2020-02-04 DIAGNOSIS — M67471 Ganglion, right ankle and foot: Secondary | ICD-10-CM | POA: Diagnosis not present

## 2020-02-04 DIAGNOSIS — G8921 Chronic pain due to trauma: Secondary | ICD-10-CM | POA: Diagnosis not present

## 2020-02-04 DIAGNOSIS — I10 Essential (primary) hypertension: Secondary | ICD-10-CM | POA: Diagnosis not present

## 2020-02-04 DIAGNOSIS — M67432 Ganglion, left wrist: Secondary | ICD-10-CM | POA: Diagnosis not present

## 2020-03-04 DIAGNOSIS — I10 Essential (primary) hypertension: Secondary | ICD-10-CM | POA: Diagnosis not present

## 2020-03-04 DIAGNOSIS — G8921 Chronic pain due to trauma: Secondary | ICD-10-CM | POA: Diagnosis not present

## 2020-04-07 DIAGNOSIS — R252 Cramp and spasm: Secondary | ICD-10-CM | POA: Diagnosis not present

## 2020-04-07 DIAGNOSIS — M67471 Ganglion, right ankle and foot: Secondary | ICD-10-CM | POA: Diagnosis not present

## 2020-04-07 DIAGNOSIS — I10 Essential (primary) hypertension: Secondary | ICD-10-CM | POA: Diagnosis not present

## 2020-04-07 DIAGNOSIS — G8921 Chronic pain due to trauma: Secondary | ICD-10-CM | POA: Diagnosis not present

## 2020-05-03 DIAGNOSIS — G8921 Chronic pain due to trauma: Secondary | ICD-10-CM | POA: Diagnosis not present

## 2020-05-03 DIAGNOSIS — R252 Cramp and spasm: Secondary | ICD-10-CM | POA: Diagnosis not present

## 2020-06-10 DIAGNOSIS — R252 Cramp and spasm: Secondary | ICD-10-CM | POA: Diagnosis not present

## 2020-06-10 DIAGNOSIS — I10 Essential (primary) hypertension: Secondary | ICD-10-CM | POA: Diagnosis not present

## 2020-06-10 DIAGNOSIS — G8921 Chronic pain due to trauma: Secondary | ICD-10-CM | POA: Diagnosis not present

## 2020-06-23 ENCOUNTER — Encounter (HOSPITAL_COMMUNITY): Payer: Self-pay

## 2020-06-23 ENCOUNTER — Emergency Department (HOSPITAL_COMMUNITY)
Admission: EM | Admit: 2020-06-23 | Discharge: 2020-06-23 | Disposition: A | Discharge status: Home / Self Care | Payer: Worker's Compensation | Attending: Emergency Medicine | Admitting: Emergency Medicine

## 2020-06-23 ENCOUNTER — Emergency Department (HOSPITAL_COMMUNITY): Payer: Worker's Compensation

## 2020-06-23 DIAGNOSIS — Y990 Civilian activity done for income or pay: Secondary | ICD-10-CM | POA: Diagnosis not present

## 2020-06-23 DIAGNOSIS — W009XXA Unspecified fall due to ice and snow, initial encounter: Secondary | ICD-10-CM | POA: Insufficient documentation

## 2020-06-23 DIAGNOSIS — S8391XA Sprain of unspecified site of right knee, initial encounter: Secondary | ICD-10-CM | POA: Insufficient documentation

## 2020-06-23 DIAGNOSIS — S8991XA Unspecified injury of right lower leg, initial encounter: Secondary | ICD-10-CM | POA: Diagnosis present

## 2020-06-23 MED ORDER — OXYCODONE-ACETAMINOPHEN 5-325 MG PO TABS
1.0000 | ORAL_TABLET | Freq: Once | ORAL | Status: AC
  Administered 2020-06-23: 1 via ORAL
  Filled 2020-06-23: qty 1

## 2020-06-23 NOTE — ED Notes (Signed)
Patient verbalized understanding of discharge instructions. Opportunity for questions and answers.  

## 2020-06-23 NOTE — Progress Notes (Signed)
Orthopedic Tech Progress Note Patient Details:  Alexander Hill [birth year] [id number]  Ortho Devices Type of Ortho Device: Knee Immobilizer,Crutches Ortho Device/Splint Location: RLE Ortho Device/Splint Interventions: Application,Adjustment   Post Interventions Patient Tolerated: Well,Ambulated well Instructions Provided: Adjustment of device   Macky Galik E Malarie Tappen 06/23/2020, 10:13 PM

## 2020-06-23 NOTE — ED Provider Notes (Signed)
[facility] EMERGENCY DEPARTMENT Provider Note   CSN: [id number] Arrival date & time: 06/23/20  1252     History No chief complaint on file.   Alexander Hill is a 45 y.o. male.  The history is provided by the patient.  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   Impact surface:  Concrete   Point of impact:  Knees   Entrapped after fall: no   Knee location:  R knee Pain details:    Quality:  Aching, shooting and throbbing   Radiates to:  Does not radiate   Severity:  Moderate   Onset quality:  Gradual   Timing:  Constant   Progression:  Unchanged Chronicity:  New Relieved by:  Rest and elevation Worsened by:  Activity, bearing weight and flexion Ineffective treatments:  None tried Associated symptoms: decreased ROM and stiffness   Associated symptoms: no numbness and no swelling   Risk factors comment:  Hx of left knee problems on pain meds at baseline      Past Medical History:  Diagnosis Date  . Diverticula of colon   . Meningitis     There are no problems to display for this patient.   History reviewed. No pertinent surgical history.     History reviewed. No pertinent family history.  Social History   Tobacco Use  . Smoking status: Never Smoker  . Smokeless tobacco: Never Used  Substance Use Topics  . Alcohol use: Yes    Comment: occas  . Drug use: No    Comment: pt takes prescribed pain medication for left knee pain    Home Medications Prior to Admission medications   Medication Sig Start Date End Date Taking? Authorizing Provider  cyclobenzaprine (FLEXERIL) 5 MG tablet Take 1 tablet (5 mg total) by mouth 3 (three) times daily as needed for muscle spasms. 08/24/15   Mabe, Latanya Maudlin, MD    Allergies    Shellfish  allergy  Review of Systems   Review of Systems  Musculoskeletal: Positive for stiffness.  All other systems reviewed and are negative.   Physical Exam Updated Vital Signs BP (!) 131/105   Pulse 77   Temp 98.6 F (37 C) (Oral)   Resp 16   Ht 5\' 8"  (1.727 m)   Wt 104.3 kg   SpO2 100%   BMI 34.97 kg/m   Physical Exam Vitals and nursing note reviewed.  Constitutional:      General: He is not in acute distress.    Appearance: Normal appearance. He is normal weight.  HENT:     Head: Normocephalic.  Eyes:     Pupils: Pupils are equal, round, and reactive to light.  Cardiovascular:     Rate and Rhythm: Normal rate.     Pulses: Normal pulses.  Pulmonary:     Effort: Pulmonary effort is normal.  Musculoskeletal:        General: Tenderness present.     Right knee: Bony tenderness present. No deformity, effusion or erythema. Tenderness present over the medial joint line and MCL. No LCL laxity or MCL laxity.     Comments: Able to flex to 90 degrees but tender.  No posterior knee tenderness.  No femur tenderness  Skin:    General: Skin is warm.     Capillary Refill: Capillary refill takes less than 2 seconds.  Neurological:     General: No focal deficit present.     Mental Status: He is alert and oriented to person, place, and time. Mental status is at baseline.  Psychiatric:        Mood and Affect: Mood normal.        Behavior: Behavior normal.        Thought Content: Thought content normal.      ED Results / Procedures / Treatments   Labs (all labs ordered are listed, but only abnormal results are displayed) Labs Reviewed - No data to display  EKG None  Radiology DG Knee Complete 4 Views Right  Result Date: 06/23/2020 CLINICAL DATA:  Pain following fall EXAM: RIGHT KNEE - COMPLETE 4+ VIEW COMPARISON:  None. FINDINGS: Frontal, lateral, and bilateral oblique views were obtained. No fracture, dislocation, or joint effusion. The joint spaces appear normal. No erosive  change. IMPRESSION: No fracture, dislocation, or joint effusion. No evident arthropathy. Electronically Signed   By: Bretta Bang III M.D.   On: 06/23/2020 14:37    Procedures Procedures (including critical care time)  Medications Ordered in ED Medications  oxyCODONE-acetaminophen (PERCOCET/ROXICET) 5-325 MG per tablet 1 tablet (1 tablet Oral Given 06/23/20 2109)    ED Course  I have reviewed the triage vital signs and the nursing notes.  Pertinent labs & imaging results that were available during my care of the patient were reviewed by me and considered in my medical decision making (see chart for details).    MDM Rules/Calculators/A&P                          Patient with a mechanical fall when he slipped on ice causing him to twist his knee and fall to the ground.  He had no other injury from the fall.  He is having ongoing pain in the medial aspect of his right knee.  No evidence of deformity and neurovascularly intact.  X-ray is negative.  We will treat for knee sprain, given crutches and he already has a specialist he can follow-up with.  MDM Number of Diagnoses or Management Options   Amount and/or Complexity of Data Reviewed Tests in the radiology section of CPT: ordered and reviewed Independent visualization of images, tracings, or specimens: yes  Patient Progress Patient progress: stable  Final Clinical Impression(s) / ED Diagnoses Final diagnoses:  Sprain of right knee, unspecified ligament, initial encounter    Rx / DC Orders ED Discharge Orders    None       Gwyneth Sprout, MD 06/23/20 2148

## 2020-06-23 NOTE — Discharge Instructions (Signed)
Use the crutches or a cane as needed.  Take pain medication you already have and ice and rest it.

## 2020-06-23 NOTE — ED Triage Notes (Signed)
Pt reports he is here today due to fall at work.Pt reports he fell onto his right knee.Pt reports he heard a pop.Pt reports right knee swelling and pain.

## 2020-06-24 DIAGNOSIS — M25562 Pain in left knee: Secondary | ICD-10-CM | POA: Diagnosis not present

## 2020-07-11 DIAGNOSIS — Z79899 Other long term (current) drug therapy: Secondary | ICD-10-CM | POA: Diagnosis not present

## 2020-07-11 DIAGNOSIS — R252 Cramp and spasm: Secondary | ICD-10-CM | POA: Diagnosis not present

## 2020-07-11 DIAGNOSIS — I10 Essential (primary) hypertension: Secondary | ICD-10-CM | POA: Diagnosis not present

## 2020-07-11 DIAGNOSIS — S8991XD Unspecified injury of right lower leg, subsequent encounter: Secondary | ICD-10-CM | POA: Diagnosis not present

## 2020-07-11 DIAGNOSIS — G8921 Chronic pain due to trauma: Secondary | ICD-10-CM | POA: Diagnosis not present

## 2020-08-01 DIAGNOSIS — S8991XD Unspecified injury of right lower leg, subsequent encounter: Secondary | ICD-10-CM | POA: Diagnosis not present

## 2020-08-01 DIAGNOSIS — R252 Cramp and spasm: Secondary | ICD-10-CM | POA: Diagnosis not present

## 2020-08-01 DIAGNOSIS — I10 Essential (primary) hypertension: Secondary | ICD-10-CM | POA: Diagnosis not present

## 2020-08-01 DIAGNOSIS — G8921 Chronic pain due to trauma: Secondary | ICD-10-CM | POA: Diagnosis not present

## 2020-09-07 DIAGNOSIS — I10 Essential (primary) hypertension: Secondary | ICD-10-CM | POA: Diagnosis not present

## 2020-09-07 DIAGNOSIS — R252 Cramp and spasm: Secondary | ICD-10-CM | POA: Diagnosis not present

## 2020-09-07 DIAGNOSIS — K5900 Constipation, unspecified: Secondary | ICD-10-CM | POA: Diagnosis not present

## 2020-09-07 DIAGNOSIS — G8921 Chronic pain due to trauma: Secondary | ICD-10-CM | POA: Diagnosis not present

## 2020-09-29 DIAGNOSIS — I10 Essential (primary) hypertension: Secondary | ICD-10-CM | POA: Diagnosis not present

## 2020-09-29 DIAGNOSIS — K5900 Constipation, unspecified: Secondary | ICD-10-CM | POA: Diagnosis not present

## 2020-09-29 DIAGNOSIS — R252 Cramp and spasm: Secondary | ICD-10-CM | POA: Diagnosis not present

## 2020-09-29 DIAGNOSIS — G8921 Chronic pain due to trauma: Secondary | ICD-10-CM | POA: Diagnosis not present

## 2020-10-27 DIAGNOSIS — Z0001 Encounter for general adult medical examination with abnormal findings: Secondary | ICD-10-CM | POA: Diagnosis not present

## 2020-10-27 DIAGNOSIS — R7303 Prediabetes: Secondary | ICD-10-CM | POA: Diagnosis not present

## 2020-10-27 DIAGNOSIS — I10 Essential (primary) hypertension: Secondary | ICD-10-CM | POA: Diagnosis not present

## 2020-11-07 DIAGNOSIS — G8921 Chronic pain due to trauma: Secondary | ICD-10-CM | POA: Diagnosis not present

## 2020-12-01 DIAGNOSIS — K5909 Other constipation: Secondary | ICD-10-CM | POA: Diagnosis not present

## 2020-12-01 DIAGNOSIS — I10 Essential (primary) hypertension: Secondary | ICD-10-CM | POA: Diagnosis not present

## 2020-12-01 DIAGNOSIS — R252 Cramp and spasm: Secondary | ICD-10-CM | POA: Diagnosis not present

## 2020-12-01 DIAGNOSIS — G894 Chronic pain syndrome: Secondary | ICD-10-CM | POA: Diagnosis not present

## 2021-01-05 DIAGNOSIS — I10 Essential (primary) hypertension: Secondary | ICD-10-CM | POA: Diagnosis not present

## 2021-01-05 DIAGNOSIS — G8921 Chronic pain due to trauma: Secondary | ICD-10-CM | POA: Diagnosis not present

## 2021-02-03 DIAGNOSIS — K5909 Other constipation: Secondary | ICD-10-CM | POA: Diagnosis not present

## 2021-02-03 DIAGNOSIS — G8921 Chronic pain due to trauma: Secondary | ICD-10-CM | POA: Diagnosis not present

## 2021-02-17 DIAGNOSIS — I10 Essential (primary) hypertension: Secondary | ICD-10-CM | POA: Diagnosis not present

## 2021-03-09 IMAGING — US VENOUS DOPPLER ULTRASOUND OF LEFT LOWER EXTREMITY
1 series · 14 of 24 positions shown · non-contrast
Comparison: None.

CLINICAL DATA: Left calf pain for 2 years

EXAM:
LEFT LOWER EXTREMITY VENOUS DUPLEX ULTRASOUND
TECHNIQUE: Doppler venous assessment of the left lower extremity deep venous
system was performed, including characterization of spectral flow,
compressibility, and phasicity.

[Series 1: venous doppler ultrasound of left lower extremity · 0.07mm/px · 14 of 41 slices shown]
[im 1/41]
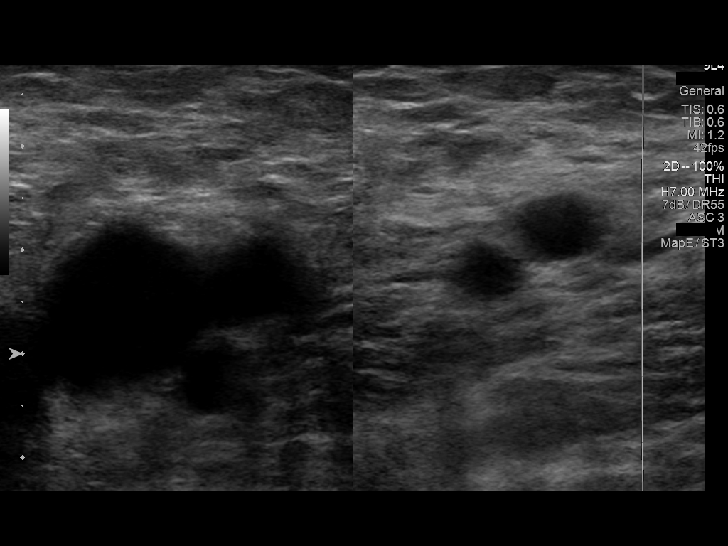
[im 4/41]
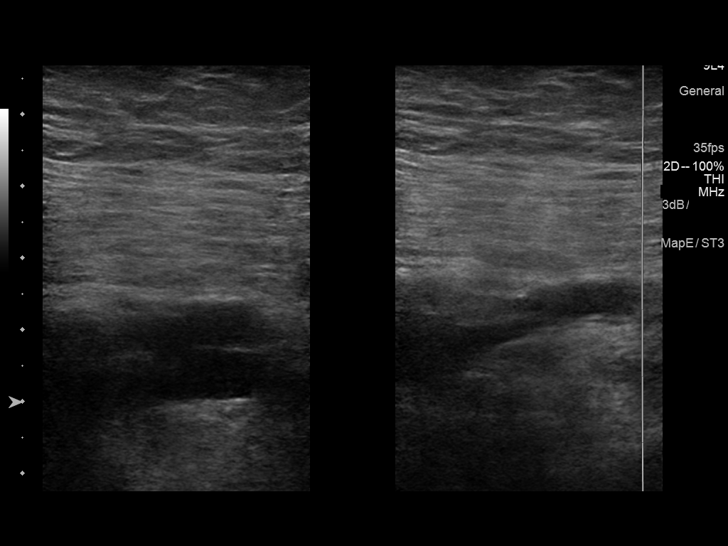
[im 7/41]
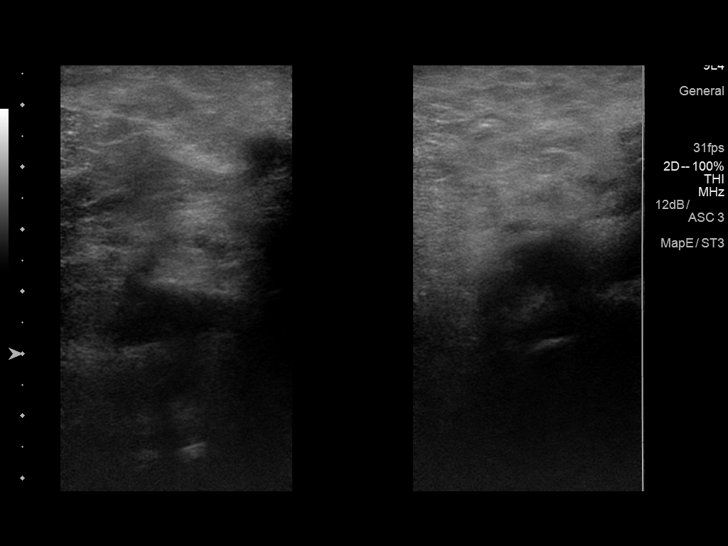
[im 11/41]
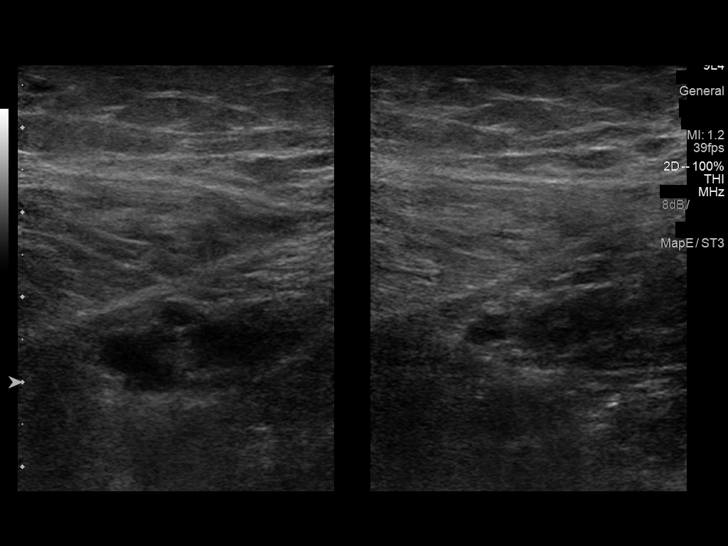
[im 13/41]
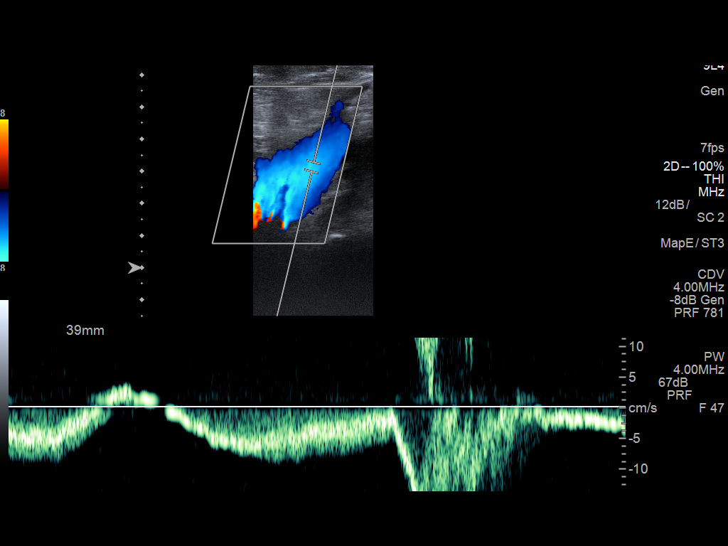
[im 16/41]
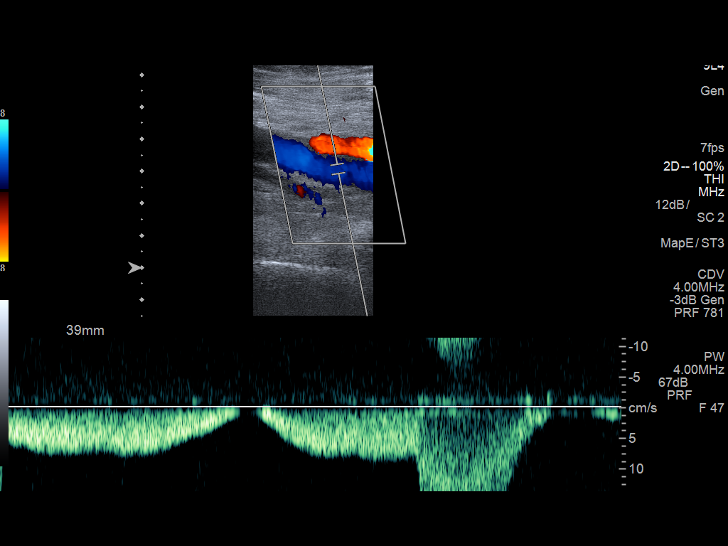
[im 20/41]
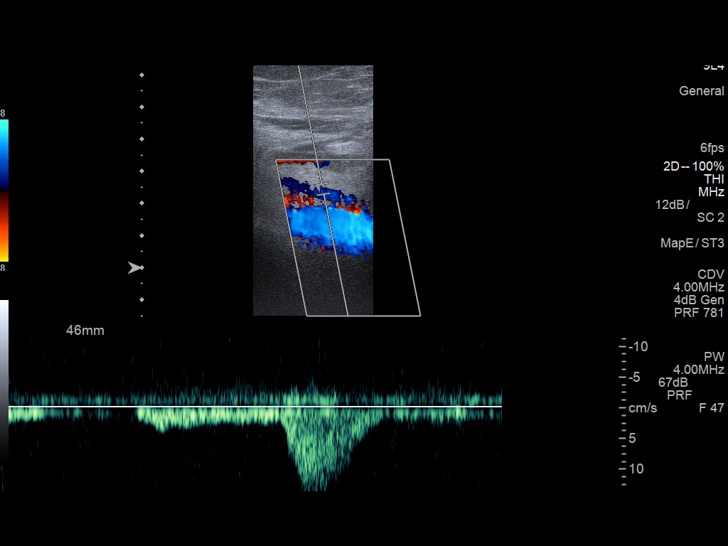
[im 21/41]
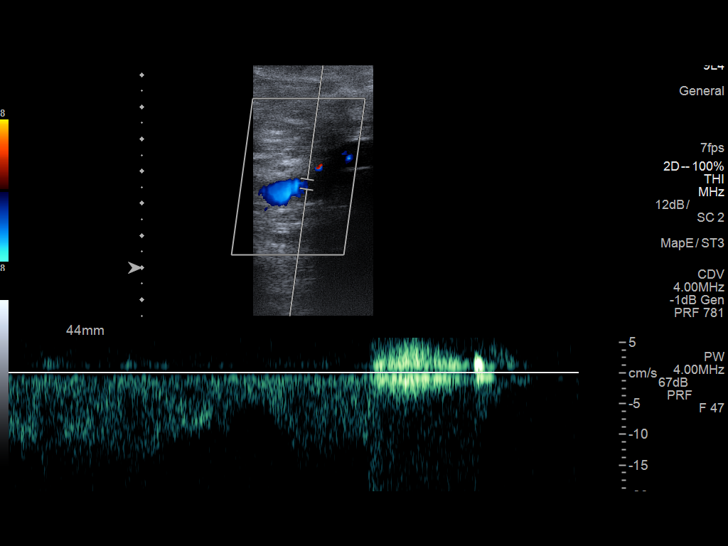
[im 25/41]
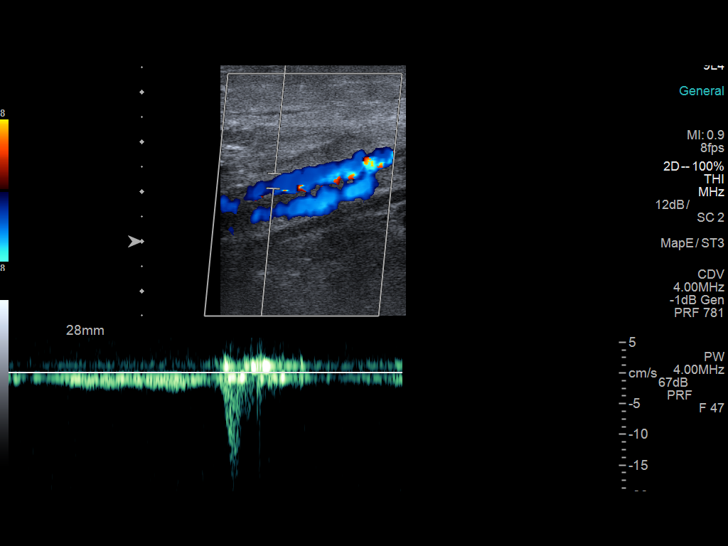
[im 28/41]
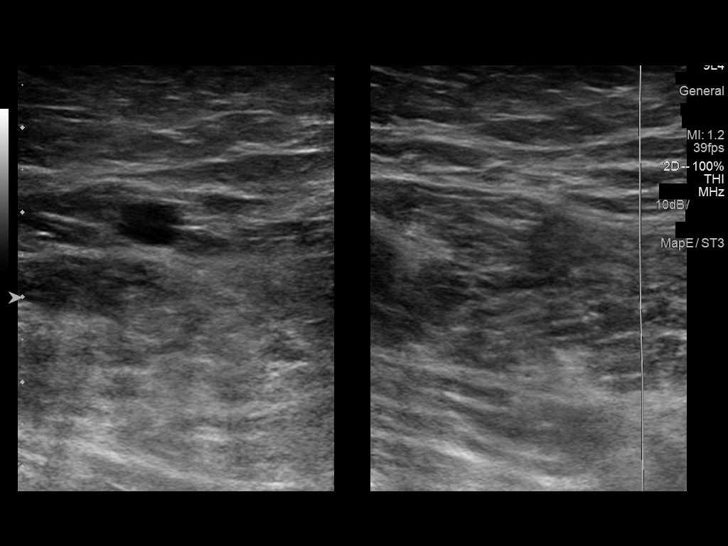
[im 32/41]
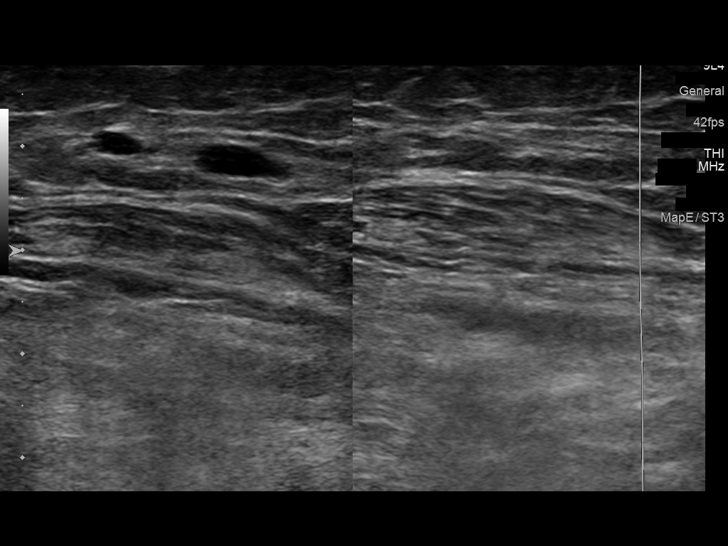
[im 34/41]
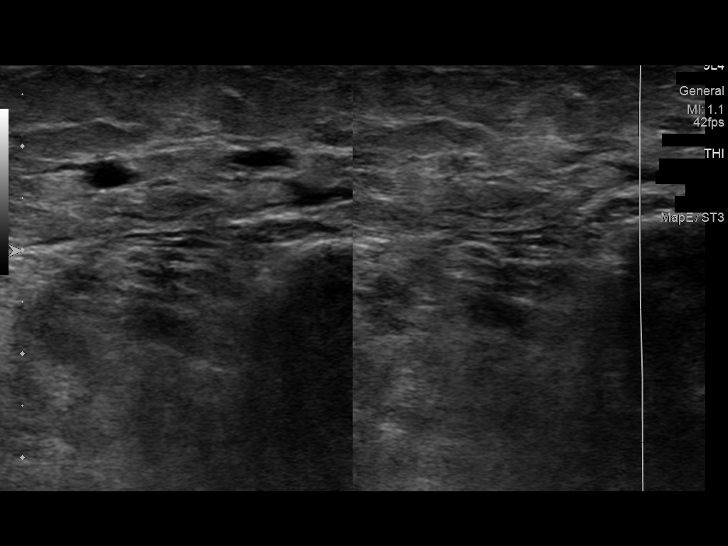
[im 37/41]
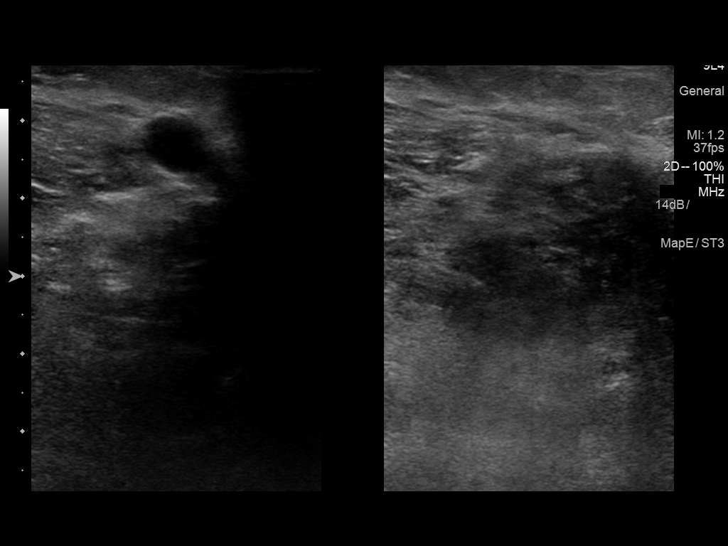
[im 41/41]
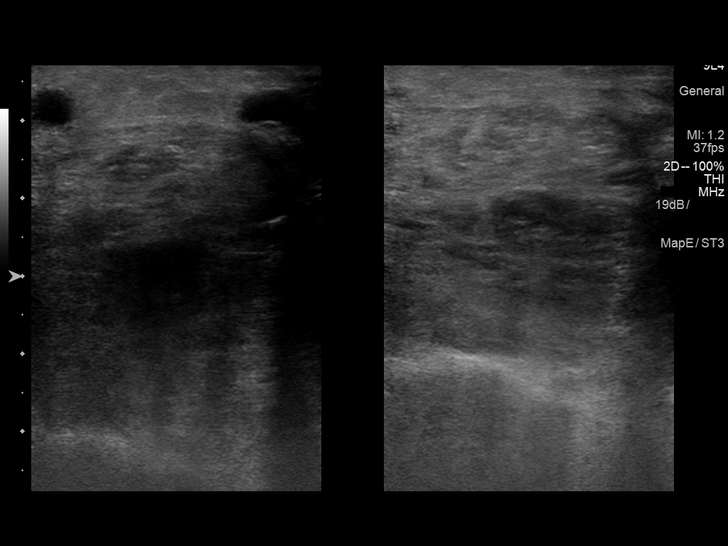

[14 of 24 positions shown; findings below may reference images not displayed]

FINDINGS: There is complete compressibility of the left common femoral,
femoral, and popliteal veins. Doppler analysis demonstrates
respiratory phasicity and augmentation of flow with calf
compression. No obvious superficial vein or calf vein thrombosis.
IMPRESSION: No evidence of left lower extremity DVT.

## 2021-03-14 DIAGNOSIS — I10 Essential (primary) hypertension: Secondary | ICD-10-CM | POA: Diagnosis not present

## 2021-03-14 DIAGNOSIS — Z23 Encounter for immunization: Secondary | ICD-10-CM | POA: Diagnosis not present

## 2021-03-14 DIAGNOSIS — R8290 Unspecified abnormal findings in urine: Secondary | ICD-10-CM | POA: Diagnosis not present

## 2021-03-14 DIAGNOSIS — Z136 Encounter for screening for cardiovascular disorders: Secondary | ICD-10-CM | POA: Diagnosis not present

## 2021-03-14 DIAGNOSIS — M1712 Unilateral primary osteoarthritis, left knee: Secondary | ICD-10-CM | POA: Diagnosis not present

## 2021-03-14 DIAGNOSIS — Z6836 Body mass index (BMI) 36.0-36.9, adult: Secondary | ICD-10-CM | POA: Diagnosis not present

## 2021-03-14 DIAGNOSIS — G8921 Chronic pain due to trauma: Secondary | ICD-10-CM | POA: Diagnosis not present

## 2021-03-14 DIAGNOSIS — Z0001 Encounter for general adult medical examination with abnormal findings: Secondary | ICD-10-CM | POA: Diagnosis not present

## 2021-04-10 DIAGNOSIS — G8921 Chronic pain due to trauma: Secondary | ICD-10-CM | POA: Diagnosis not present

## 2021-04-10 DIAGNOSIS — I10 Essential (primary) hypertension: Secondary | ICD-10-CM | POA: Diagnosis not present

## 2021-04-20 DIAGNOSIS — M1712 Unilateral primary osteoarthritis, left knee: Secondary | ICD-10-CM | POA: Diagnosis not present

## 2021-05-11 DIAGNOSIS — I10 Essential (primary) hypertension: Secondary | ICD-10-CM | POA: Diagnosis not present

## 2021-05-11 DIAGNOSIS — G8921 Chronic pain due to trauma: Secondary | ICD-10-CM | POA: Diagnosis not present

## 2021-06-08 DIAGNOSIS — G8921 Chronic pain due to trauma: Secondary | ICD-10-CM | POA: Diagnosis not present

## 2021-06-08 DIAGNOSIS — I10 Essential (primary) hypertension: Secondary | ICD-10-CM | POA: Diagnosis not present

## 2021-06-08 DIAGNOSIS — K5909 Other constipation: Secondary | ICD-10-CM | POA: Diagnosis not present

## 2021-06-23 DIAGNOSIS — I10 Essential (primary) hypertension: Secondary | ICD-10-CM | POA: Diagnosis not present

## 2021-06-23 DIAGNOSIS — R7303 Prediabetes: Secondary | ICD-10-CM | POA: Diagnosis not present

## 2021-06-29 DIAGNOSIS — M179 Osteoarthritis of knee, unspecified: Secondary | ICD-10-CM | POA: Diagnosis not present

## 2021-06-29 DIAGNOSIS — G8921 Chronic pain due to trauma: Secondary | ICD-10-CM | POA: Diagnosis not present

## 2021-06-29 DIAGNOSIS — K5909 Other constipation: Secondary | ICD-10-CM | POA: Diagnosis not present

## 2021-08-03 DIAGNOSIS — G8921 Chronic pain due to trauma: Secondary | ICD-10-CM | POA: Diagnosis not present

## 2021-08-03 DIAGNOSIS — I10 Essential (primary) hypertension: Secondary | ICD-10-CM | POA: Diagnosis not present

## 2021-08-03 DIAGNOSIS — M179 Osteoarthritis of knee, unspecified: Secondary | ICD-10-CM | POA: Diagnosis not present

## 2021-08-03 DIAGNOSIS — R7303 Prediabetes: Secondary | ICD-10-CM | POA: Diagnosis not present

## 2021-08-31 DIAGNOSIS — I10 Essential (primary) hypertension: Secondary | ICD-10-CM | POA: Diagnosis not present

## 2021-08-31 DIAGNOSIS — R7303 Prediabetes: Secondary | ICD-10-CM | POA: Diagnosis not present

## 2021-08-31 DIAGNOSIS — G8921 Chronic pain due to trauma: Secondary | ICD-10-CM | POA: Diagnosis not present

## 2021-08-31 DIAGNOSIS — K5909 Other constipation: Secondary | ICD-10-CM | POA: Diagnosis not present

## 2021-09-29 DIAGNOSIS — Z0001 Encounter for general adult medical examination with abnormal findings: Secondary | ICD-10-CM | POA: Diagnosis not present

## 2021-09-29 DIAGNOSIS — Z136 Encounter for screening for cardiovascular disorders: Secondary | ICD-10-CM | POA: Diagnosis not present

## 2021-09-29 DIAGNOSIS — R7303 Prediabetes: Secondary | ICD-10-CM | POA: Diagnosis not present

## 2021-10-05 DIAGNOSIS — K5909 Other constipation: Secondary | ICD-10-CM | POA: Diagnosis not present

## 2021-10-05 DIAGNOSIS — Z23 Encounter for immunization: Secondary | ICD-10-CM | POA: Diagnosis not present

## 2021-10-05 DIAGNOSIS — Z0001 Encounter for general adult medical examination with abnormal findings: Secondary | ICD-10-CM | POA: Diagnosis not present

## 2021-10-05 DIAGNOSIS — G8921 Chronic pain due to trauma: Secondary | ICD-10-CM | POA: Diagnosis not present

## 2021-10-05 DIAGNOSIS — R7303 Prediabetes: Secondary | ICD-10-CM | POA: Diagnosis not present

## 2021-10-05 DIAGNOSIS — I10 Essential (primary) hypertension: Secondary | ICD-10-CM | POA: Diagnosis not present

## 2021-11-03 DIAGNOSIS — R8290 Unspecified abnormal findings in urine: Secondary | ICD-10-CM | POA: Diagnosis not present

## 2021-11-03 DIAGNOSIS — I10 Essential (primary) hypertension: Secondary | ICD-10-CM | POA: Diagnosis not present

## 2021-11-03 DIAGNOSIS — K5909 Other constipation: Secondary | ICD-10-CM | POA: Diagnosis not present

## 2021-11-03 DIAGNOSIS — G8921 Chronic pain due to trauma: Secondary | ICD-10-CM | POA: Diagnosis not present

## 2021-12-08 DIAGNOSIS — I10 Essential (primary) hypertension: Secondary | ICD-10-CM | POA: Diagnosis not present

## 2021-12-08 DIAGNOSIS — G8921 Chronic pain due to trauma: Secondary | ICD-10-CM | POA: Diagnosis not present

## 2021-12-08 DIAGNOSIS — K5909 Other constipation: Secondary | ICD-10-CM | POA: Diagnosis not present

## 2022-01-04 DIAGNOSIS — G8921 Chronic pain due to trauma: Secondary | ICD-10-CM | POA: Diagnosis not present

## 2022-01-04 DIAGNOSIS — I10 Essential (primary) hypertension: Secondary | ICD-10-CM | POA: Diagnosis not present

## 2022-01-04 DIAGNOSIS — K5909 Other constipation: Secondary | ICD-10-CM | POA: Diagnosis not present

## 2022-02-08 DIAGNOSIS — Z5181 Encounter for therapeutic drug level monitoring: Secondary | ICD-10-CM | POA: Diagnosis not present

## 2022-02-08 DIAGNOSIS — I10 Essential (primary) hypertension: Secondary | ICD-10-CM | POA: Diagnosis not present

## 2022-02-08 DIAGNOSIS — G8921 Chronic pain due to trauma: Secondary | ICD-10-CM | POA: Diagnosis not present

## 2022-03-15 DIAGNOSIS — G8921 Chronic pain due to trauma: Secondary | ICD-10-CM | POA: Diagnosis not present

## 2022-03-15 DIAGNOSIS — I10 Essential (primary) hypertension: Secondary | ICD-10-CM | POA: Diagnosis not present

## 2022-03-15 DIAGNOSIS — Z5181 Encounter for therapeutic drug level monitoring: Secondary | ICD-10-CM | POA: Diagnosis not present

## 2022-04-13 ENCOUNTER — Other Ambulatory Visit (HOSPITAL_COMMUNITY): Payer: Self-pay | Admitting: Internal Medicine

## 2022-04-13 DIAGNOSIS — E785 Hyperlipidemia, unspecified: Secondary | ICD-10-CM | POA: Diagnosis not present

## 2022-04-13 DIAGNOSIS — M79662 Pain in left lower leg: Secondary | ICD-10-CM | POA: Diagnosis not present

## 2022-04-13 DIAGNOSIS — I10 Essential (primary) hypertension: Secondary | ICD-10-CM | POA: Diagnosis not present

## 2022-04-13 DIAGNOSIS — G8921 Chronic pain due to trauma: Secondary | ICD-10-CM | POA: Diagnosis not present

## 2022-04-18 ENCOUNTER — Ambulatory Visit (HOSPITAL_COMMUNITY)
Admission: RE | Admit: 2022-04-18 | Discharge: 2022-04-18 | Disposition: A | Discharge status: Home / Self Care | Payer: BC Managed Care – PPO | Source: Ambulatory Visit | Attending: Internal Medicine | Admitting: Internal Medicine

## 2022-04-18 ENCOUNTER — Encounter (HOSPITAL_COMMUNITY): Payer: BC Managed Care – PPO

## 2022-04-18 DIAGNOSIS — M79662 Pain in left lower leg: Secondary | ICD-10-CM | POA: Diagnosis not present

## 2022-04-25 ENCOUNTER — Encounter (HOSPITAL_COMMUNITY): Payer: BC Managed Care – PPO

## 2022-05-11 DIAGNOSIS — R8290 Unspecified abnormal findings in urine: Secondary | ICD-10-CM | POA: Diagnosis not present

## 2022-05-11 DIAGNOSIS — I10 Essential (primary) hypertension: Secondary | ICD-10-CM | POA: Diagnosis not present

## 2022-05-11 DIAGNOSIS — E785 Hyperlipidemia, unspecified: Secondary | ICD-10-CM | POA: Diagnosis not present

## 2022-05-11 DIAGNOSIS — R7303 Prediabetes: Secondary | ICD-10-CM | POA: Diagnosis not present

## 2022-05-14 DIAGNOSIS — R7303 Prediabetes: Secondary | ICD-10-CM | POA: Diagnosis not present

## 2022-05-14 DIAGNOSIS — I10 Essential (primary) hypertension: Secondary | ICD-10-CM | POA: Diagnosis not present

## 2022-05-14 DIAGNOSIS — G8921 Chronic pain due to trauma: Secondary | ICD-10-CM | POA: Diagnosis not present

## 2022-05-14 DIAGNOSIS — M79662 Pain in left lower leg: Secondary | ICD-10-CM | POA: Diagnosis not present

## 2022-06-15 DIAGNOSIS — K5900 Constipation, unspecified: Secondary | ICD-10-CM | POA: Diagnosis not present

## 2022-06-15 DIAGNOSIS — R7303 Prediabetes: Secondary | ICD-10-CM | POA: Diagnosis not present

## 2022-06-15 DIAGNOSIS — M179 Osteoarthritis of knee, unspecified: Secondary | ICD-10-CM | POA: Diagnosis not present

## 2022-06-15 DIAGNOSIS — E785 Hyperlipidemia, unspecified: Secondary | ICD-10-CM | POA: Diagnosis not present

## 2022-06-15 DIAGNOSIS — I10 Essential (primary) hypertension: Secondary | ICD-10-CM | POA: Diagnosis not present

## 2022-07-13 DIAGNOSIS — E785 Hyperlipidemia, unspecified: Secondary | ICD-10-CM | POA: Diagnosis not present

## 2022-07-13 DIAGNOSIS — G8921 Chronic pain due to trauma: Secondary | ICD-10-CM | POA: Diagnosis not present

## 2022-07-13 DIAGNOSIS — I10 Essential (primary) hypertension: Secondary | ICD-10-CM | POA: Diagnosis not present

## 2022-07-13 DIAGNOSIS — M179 Osteoarthritis of knee, unspecified: Secondary | ICD-10-CM | POA: Diagnosis not present

## 2022-08-16 DIAGNOSIS — I10 Essential (primary) hypertension: Secondary | ICD-10-CM | POA: Diagnosis not present

## 2022-08-16 DIAGNOSIS — E785 Hyperlipidemia, unspecified: Secondary | ICD-10-CM | POA: Diagnosis not present

## 2022-08-16 DIAGNOSIS — M179 Osteoarthritis of knee, unspecified: Secondary | ICD-10-CM | POA: Diagnosis not present

## 2022-08-16 DIAGNOSIS — G8921 Chronic pain due to trauma: Secondary | ICD-10-CM | POA: Diagnosis not present

## 2022-09-20 DIAGNOSIS — M179 Osteoarthritis of knee, unspecified: Secondary | ICD-10-CM | POA: Diagnosis not present

## 2022-09-20 DIAGNOSIS — R7303 Prediabetes: Secondary | ICD-10-CM | POA: Diagnosis not present

## 2022-09-20 DIAGNOSIS — I10 Essential (primary) hypertension: Secondary | ICD-10-CM | POA: Diagnosis not present

## 2022-09-20 DIAGNOSIS — G8921 Chronic pain due to trauma: Secondary | ICD-10-CM | POA: Diagnosis not present

## 2022-09-20 DIAGNOSIS — E785 Hyperlipidemia, unspecified: Secondary | ICD-10-CM | POA: Diagnosis not present

## 2022-10-18 DIAGNOSIS — G8921 Chronic pain due to trauma: Secondary | ICD-10-CM | POA: Diagnosis not present

## 2022-10-18 DIAGNOSIS — Z23 Encounter for immunization: Secondary | ICD-10-CM | POA: Diagnosis not present

## 2022-10-18 DIAGNOSIS — Z1211 Encounter for screening for malignant neoplasm of colon: Secondary | ICD-10-CM | POA: Diagnosis not present

## 2022-10-18 DIAGNOSIS — E785 Hyperlipidemia, unspecified: Secondary | ICD-10-CM | POA: Diagnosis not present

## 2022-10-18 DIAGNOSIS — Z0001 Encounter for general adult medical examination with abnormal findings: Secondary | ICD-10-CM | POA: Diagnosis not present

## 2022-10-18 DIAGNOSIS — I10 Essential (primary) hypertension: Secondary | ICD-10-CM | POA: Diagnosis not present

## 2022-12-07 DIAGNOSIS — R7303 Prediabetes: Secondary | ICD-10-CM | POA: Diagnosis not present

## 2022-12-07 DIAGNOSIS — K5909 Other constipation: Secondary | ICD-10-CM | POA: Diagnosis not present

## 2022-12-07 DIAGNOSIS — I10 Essential (primary) hypertension: Secondary | ICD-10-CM | POA: Diagnosis not present

## 2022-12-07 DIAGNOSIS — G8921 Chronic pain due to trauma: Secondary | ICD-10-CM | POA: Diagnosis not present

## 2023-01-17 DIAGNOSIS — R7303 Prediabetes: Secondary | ICD-10-CM | POA: Diagnosis not present

## 2023-01-17 DIAGNOSIS — K5909 Other constipation: Secondary | ICD-10-CM | POA: Diagnosis not present

## 2023-01-17 DIAGNOSIS — I10 Essential (primary) hypertension: Secondary | ICD-10-CM | POA: Diagnosis not present

## 2023-01-17 DIAGNOSIS — G8921 Chronic pain due to trauma: Secondary | ICD-10-CM | POA: Diagnosis not present

## 2023-02-19 DIAGNOSIS — K5909 Other constipation: Secondary | ICD-10-CM | POA: Diagnosis not present

## 2023-02-19 DIAGNOSIS — I10 Essential (primary) hypertension: Secondary | ICD-10-CM | POA: Diagnosis not present

## 2023-02-19 DIAGNOSIS — G8921 Chronic pain due to trauma: Secondary | ICD-10-CM | POA: Diagnosis not present

## 2023-03-15 DIAGNOSIS — G8921 Chronic pain due to trauma: Secondary | ICD-10-CM | POA: Diagnosis not present

## 2023-03-15 DIAGNOSIS — I10 Essential (primary) hypertension: Secondary | ICD-10-CM | POA: Diagnosis not present

## 2023-03-15 DIAGNOSIS — K5909 Other constipation: Secondary | ICD-10-CM | POA: Diagnosis not present

## 2023-04-19 DIAGNOSIS — G8921 Chronic pain due to trauma: Secondary | ICD-10-CM | POA: Diagnosis not present

## 2023-04-19 DIAGNOSIS — I10 Essential (primary) hypertension: Secondary | ICD-10-CM | POA: Diagnosis not present

## 2023-04-19 DIAGNOSIS — K5909 Other constipation: Secondary | ICD-10-CM | POA: Diagnosis not present

## 2023-05-24 DIAGNOSIS — K5909 Other constipation: Secondary | ICD-10-CM | POA: Diagnosis not present

## 2023-05-24 DIAGNOSIS — G8921 Chronic pain due to trauma: Secondary | ICD-10-CM | POA: Diagnosis not present

## 2023-05-24 DIAGNOSIS — I10 Essential (primary) hypertension: Secondary | ICD-10-CM | POA: Diagnosis not present

## 2023-06-21 DIAGNOSIS — I10 Essential (primary) hypertension: Secondary | ICD-10-CM | POA: Diagnosis not present

## 2023-06-21 DIAGNOSIS — G8921 Chronic pain due to trauma: Secondary | ICD-10-CM | POA: Diagnosis not present

## 2023-06-21 DIAGNOSIS — K5909 Other constipation: Secondary | ICD-10-CM | POA: Diagnosis not present

## 2023-07-26 DIAGNOSIS — K5909 Other constipation: Secondary | ICD-10-CM | POA: Diagnosis not present

## 2023-07-26 DIAGNOSIS — G8921 Chronic pain due to trauma: Secondary | ICD-10-CM | POA: Diagnosis not present

## 2023-07-26 DIAGNOSIS — I10 Essential (primary) hypertension: Secondary | ICD-10-CM | POA: Diagnosis not present
